# Patient Record
Sex: Female | Born: 1990 | Race: Black or African American | Hispanic: No | Marital: Single | Smoking: Former smoker
Health system: Southern US, Community
[De-identification: ages and names within clinical notes are randomized; demographics above are authoritative.]

## PROBLEM LIST (undated history)

## (undated) DIAGNOSIS — F1911 Other psychoactive substance abuse, in remission: Secondary | ICD-10-CM

## (undated) DIAGNOSIS — F250 Schizoaffective disorder, bipolar type: Secondary | ICD-10-CM

## (undated) HISTORY — DX: Schizoaffective disorder, bipolar type: F25.0

## (undated) HISTORY — DX: Other psychoactive substance abuse, in remission: F19.11

## (undated) HISTORY — PX: WISDOM TOOTH EXTRACTION: SHX21

---

## 2014-06-14 LAB — URINALYSIS, COMPLETE
BILIRUBIN, UR: NEGATIVE
BLOOD: NEGATIVE
Bacteria: NONE SEEN
Glucose,UR: NEGATIVE mg/dL (ref 0–75)
LEUKOCYTE ESTERASE: NEGATIVE
Nitrite: NEGATIVE
PH: 5 (ref 4.5–8.0)
Protein: 100
RBC,UR: 1 /HPF (ref 0–5)
Specific Gravity: 1.033 (ref 1.003–1.030)
WBC UR: 1 /HPF (ref 0–5)

## 2014-06-14 LAB — CBC
HCT: 36.7 % (ref 35.0–47.0)
HGB: 11.7 g/dL — AB (ref 12.0–16.0)
MCH: 26.5 pg (ref 26.0–34.0)
MCHC: 31.9 g/dL — AB (ref 32.0–36.0)
MCV: 83 fL (ref 80–100)
Platelet: 339 10*3/uL (ref 150–440)
RBC: 4.42 10*6/uL (ref 3.80–5.20)
RDW: 15.8 % — ABNORMAL HIGH (ref 11.5–14.5)
WBC: 8 10*3/uL (ref 3.6–11.0)

## 2014-06-14 LAB — ETHANOL: Ethanol: <3 mg/dL

## 2014-06-14 LAB — COMPREHENSIVE METABOLIC PANEL
ANION GAP: 14 (ref 7–16)
AST: 24 U/L (ref 15–37)
Albumin: 4 g/dL (ref 3.4–5.0)
Alkaline Phosphatase: 50 U/L (ref 46–116)
BILIRUBIN TOTAL: 0.3 mg/dL (ref 0.2–1.0)
BUN: 12 mg/dL (ref 7–18)
Calcium, Total: 9.4 mg/dL (ref 8.5–10.1)
Chloride: 108 mmol/L — ABNORMAL HIGH (ref 98–107)
Co2: 23 mmol/L (ref 21–32)
Creatinine: 0.91 mg/dL (ref 0.60–1.30)
EGFR (African American): >60
Glucose: 93 mg/dL (ref 65–99)
OSMOLALITY: 288 (ref 275–301)
Potassium: 4.3 mmol/L (ref 3.5–5.1)
SGPT (ALT): 20 U/L (ref 14–63)
SODIUM: 145 mmol/L (ref 136–145)
Total Protein: 8.1 g/dL (ref 6.4–8.2)

## 2014-06-14 LAB — SALICYLATE LEVEL

## 2014-06-14 LAB — DRUG SCREEN, URINE
Amphetamines, Ur Screen: NEGATIVE (ref ?–1000)
Barbiturates, Ur Screen: NEGATIVE (ref ?–200)
Benzodiazepine, Ur Scrn: NEGATIVE (ref ?–200)
CANNABINOID 50 NG, UR ~~LOC~~: POSITIVE (ref ?–50)
Cocaine Metabolite,Ur ~~LOC~~: NEGATIVE (ref ?–300)
MDMA (Ecstasy)Ur Screen: NEGATIVE (ref ?–500)
Methadone, Ur Screen: NEGATIVE (ref ?–300)
Opiate, Ur Screen: NEGATIVE (ref ?–300)
PHENCYCLIDINE (PCP) UR S: NEGATIVE (ref ?–25)
Tricyclic, Ur Screen: NEGATIVE (ref ?–1000)

## 2014-06-14 LAB — ACETAMINOPHEN LEVEL

## 2014-06-16 ENCOUNTER — Inpatient Hospital Stay: Payer: Self-pay | Admitting: Psychiatry

## 2014-06-18 LAB — TSH: Thyroid Stimulating Horm: 0.584 u[IU]/mL

## 2014-06-18 LAB — LIPID PANEL
Cholesterol: 163 mg/dL (ref 0–200)
HDL Cholesterol: 94 mg/dL — ABNORMAL HIGH (ref 40–60)
LDL CHOLESTEROL, CALC: 57 mg/dL (ref 0–100)
Triglycerides: 60 mg/dL (ref 0–200)
VLDL CHOLESTEROL, CALC: 12 mg/dL (ref 5–40)

## 2014-06-18 LAB — HEMOGLOBIN A1C: Hemoglobin A1C: 5.7 % (ref 4.2–6.3)

## 2014-09-13 NOTE — Consult Note (Signed)
Brief Consult Note: Comments: Have attempted twice today to interview patient but have not been able to arrouse her enough for her to talk to me yet. Remains asleep after a period of reported bizzare behavior this morning. Will reattempt before the end of the day.  Electronic Signatures: Audery Amellapacs, John T (MD)  (Signed 01-Feb-16 15:28)  Authored: Brief Consult Note   Last Updated: 01-Feb-16 15:28 by Audery Amellapacs, John T (MD)
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
## 2014-09-13 NOTE — Consult Note (Signed)
Brief Consult Note: Comments: Attempted again to interview patient later in the afternoon but again she would not or could not get awake enough for conversation. Reattempt tomorrow.  Electronic Signatures: Audery Amellapacs, John T (MD)  (Signed 01-Feb-16 21:42)  Authored: Brief Consult Note   Last Updated: 01-Feb-16 21:42 by Audery Amellapacs, John T (MD)

## 2015-12-26 ENCOUNTER — Emergency Department
Admission: EM | Admit: 2015-12-26 | Discharge: 2015-12-28 | Disposition: A | Discharge status: Other Acute Inpatient Hospital | Payer: Self-pay | Attending: Emergency Medicine | Admitting: Emergency Medicine

## 2015-12-26 ENCOUNTER — Encounter: Payer: Self-pay | Admitting: Emergency Medicine

## 2015-12-26 DIAGNOSIS — F3164 Bipolar disorder, current episode mixed, severe, with psychotic features: Secondary | ICD-10-CM

## 2015-12-26 DIAGNOSIS — F17200 Nicotine dependence, unspecified, uncomplicated: Secondary | ICD-10-CM | POA: Insufficient documentation

## 2015-12-26 DIAGNOSIS — F29 Unspecified psychosis not due to a substance or known physiological condition: Secondary | ICD-10-CM | POA: Insufficient documentation

## 2015-12-26 DIAGNOSIS — Z5181 Encounter for therapeutic drug level monitoring: Secondary | ICD-10-CM | POA: Insufficient documentation

## 2015-12-26 LAB — URINE DRUG SCREEN, QUALITATIVE (ARMC ONLY)
Amphetamines, Ur Screen: NOT DETECTED
Barbiturates, Ur Screen: NOT DETECTED
Benzodiazepine, Ur Scrn: NOT DETECTED
Cannabinoid 50 Ng, Ur ~~LOC~~: POSITIVE — AB
Cocaine Metabolite,Ur ~~LOC~~: NOT DETECTED
MDMA (Ecstasy)Ur Screen: NOT DETECTED
Methadone Scn, Ur: NOT DETECTED
Opiate, Ur Screen: NOT DETECTED
Phencyclidine (PCP) Ur S: NOT DETECTED
Tricyclic, Ur Screen: NOT DETECTED

## 2015-12-26 LAB — COMPREHENSIVE METABOLIC PANEL
ALT: 19 U/L (ref 14–54)
AST: 22 U/L (ref 15–41)
Albumin: 4.3 g/dL (ref 3.5–5.0)
Alkaline Phosphatase: 46 U/L (ref 38–126)
Anion gap: 8 (ref 5–15)
BUN: 11 mg/dL (ref 6–20)
CHLORIDE: 104 mmol/L (ref 101–111)
CO2: 23 mmol/L (ref 22–32)
CREATININE: 0.97 mg/dL (ref 0.44–1.00)
Calcium: 9.8 mg/dL (ref 8.9–10.3)
GFR calc Af Amer: >60 mL/min (ref >60)
GFR calc non Af Amer: >60 mL/min (ref >60)
Glucose, Bld: 138 mg/dL — ABNORMAL HIGH (ref 65–99)
Potassium: 3.9 mmol/L (ref 3.5–5.1)
SODIUM: 135 mmol/L (ref 135–145)
Total Bilirubin: 0.5 mg/dL (ref 0.3–1.2)
Total Protein: 8.1 g/dL (ref 6.5–8.1)

## 2015-12-26 LAB — CBC
HEMATOCRIT: 37.3 % (ref 35.0–47.0)
Hemoglobin: 12.4 g/dL (ref 12.0–16.0)
MCH: 27.4 pg (ref 26.0–34.0)
MCHC: 33.3 g/dL (ref 32.0–36.0)
MCV: 82.5 fL (ref 80.0–100.0)
PLATELETS: 354 10*3/uL (ref 150–440)
RBC: 4.52 MIL/uL (ref 3.80–5.20)
RDW: 17.8 % — ABNORMAL HIGH (ref 11.5–14.5)
WBC: 5.4 10*3/uL (ref 3.6–11.0)

## 2015-12-26 LAB — PREGNANCY, URINE: Preg Test, Ur: NEGATIVE

## 2015-12-26 LAB — ETHANOL: Alcohol, Ethyl (B): <5 mg/dL (ref <5)

## 2015-12-26 MED ORDER — LORAZEPAM 2 MG/ML IJ SOLN
INTRAMUSCULAR | Status: AC
  Administered 2015-12-26: 2 mg via INTRAMUSCULAR
  Filled 2015-12-26: qty 1

## 2015-12-26 MED ORDER — RISPERIDONE 1 MG PO TBDP: 1.0000 mg | ORAL_TABLET | ORAL | Status: DC

## 2015-12-26 MED ORDER — HALOPERIDOL LACTATE 5 MG/ML IJ SOLN
INTRAMUSCULAR | Status: AC
  Administered 2015-12-26: 5 mg via INTRAMUSCULAR
  Filled 2015-12-26: qty 1

## 2015-12-26 MED ORDER — HALOPERIDOL LACTATE 5 MG/ML IJ SOLN
5.0000 mg | Freq: Once | INTRAMUSCULAR | Status: AC
  Administered 2015-12-26: 5 mg via INTRAMUSCULAR

## 2015-12-26 MED ORDER — PALIPERIDONE ER 3 MG PO TB24
3.0000 mg | ORAL_TABLET | Freq: Every day | ORAL | Status: DC
  Administered 2015-12-27: 3 mg via ORAL

## 2015-12-26 MED ORDER — LORAZEPAM 2 MG PO TABS
2.0000 mg | ORAL_TABLET | ORAL | Status: DC | PRN
  Administered 2015-12-27 (×2): 2 mg via ORAL
  Filled 2015-12-26 (×2): qty 1

## 2015-12-26 MED ORDER — LORAZEPAM 2 MG/ML IJ SOLN
2.0000 mg | Freq: Once | INTRAMUSCULAR | Status: AC
  Administered 2015-12-26: 2 mg via INTRAMUSCULAR
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
## 2015-12-26 NOTE — ED Notes (Signed)
Pt states she has panic attacks and states she is trying to calm herself down. Pt has taken medication in the past for anxiety but is not currently on anything. Father also states she cries a lot uncontrollably as well.

## 2015-12-26 NOTE — ED Notes (Signed)
Patient initially put ordered medications in her mouth but spit them out. She started screaming "who is my mother?" and making bizarre threatening statements.

## 2015-12-26 NOTE — ED Notes (Signed)
TTS at bedside at this time.  

## 2015-12-26 NOTE — ED Notes (Signed)
Case discussed with EDMD - Dr. Shaune PollackLord. Described patient behaviors and requested change for IM medications. Dr. Shaune PollackLord referred case to psychiatrist. Dr. Toni Amendlapacs ordered new medications.

## 2015-12-26 NOTE — ED Notes (Signed)
Pt singing and talking loudly in her room. Pt is alone. Pt nurse goes into room to ask her to talk quietly. Pt States" I would like to go home with my parents now". Nurse informs pt that she needs to stay until she is evaluated by Psychiatrist. Pt states "fine I will do what ever you need me to do"

## 2015-12-26 NOTE — ED Triage Notes (Signed)
Pt has been living alone for the past 6-7 months and recently moved back in with family last week. Pt denies any SI/HI but states she has had SI in the past when she was younger.

## 2015-12-26 NOTE — ED Provider Notes (Signed)
[facility] Emergency Department Provider Note ____________________________________________   I have reviewed the triage vital signs and the triage nursing note.  HISTORY  Chief Complaint Psychiatric Evaluation   Historian Limited due to patient's apparent altered mental status/psychosis. She is a poor historian.  HPI Jodi Stephens is a 25 y.o. female without significant past medical history, but she states that she was treated for anxiety in the past, who is presenting here due to labile mood, and panic attacks. Patient states that she has dealt with anxiety for many years.She states that recently she moved back in with her parents because she was not doing well living on her own. 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 "When I asked her if she hears voices, and her from the television, she says that they transmit to her brain.  She asked me if I have a implant in my ear, and when I asked her why she thinks to have an implant in my ear, she stated that it was because I had stutter (I do not have a stutter).  She burst into tears intermittently. She told me about how she thinks strangers are saying bad things about her. She goes back to high school times when she states that she was playing basketball and other people got more tension than she did.  Denies suicidal ideation. Denies any self injury.  She states that she has been hearing voices and people's and sounds that are not there.  No reported prior history of psychiatric hospitalization.   History reviewed. No pertinent past medical history.  There are no active problems to display for this patient.   History reviewed. No pertinent surgical history.  Prior to Admission medications   Not on File    No Known Allergies  History reviewed. No  pertinent family history.  Social History Social History  Substance Use Topics  . Smoking status: Current Some Day Smoker  . Smokeless tobacco: Not on file  . Alcohol use Yes    Review of Systems  Constitutional: Negative for Recent illness. Eyes: Negative for visual changes. ENT: Negative for sore throat. Cardiovascular: Negative for chest pain. Respiratory: Negative for shortness of breath. Gastrointestinal: Negative for abdominal pain, vomiting and diarrhea. Genitourinary: Negative for dysuria. Musculoskeletal: Negative for back pain. Skin: Negative for rash. Neurological: Negative for headache. 10 point Review of Systems otherwise negative ____________________________________________   PHYSICAL EXAM:  VITAL SIGNS: ED Triage Vitals  Enc Vitals Group     BP 12/26/15 0944 (!) 159/97     Pulse Rate 12/26/15 0944 (!) 106     Resp 12/26/15 0944 (!) 22     Temp 12/26/15 0944 99.3 F (37.4 C)     Temp Source 12/26/15 0944 Oral     SpO2 12/26/15 0944 100 %     Weight --      Height 12/26/15 0945  (1.626 m)     Head Circumference --      Peak Flow --      Pain Score 12/26/15 0940 0     Pain Loc --      Pain Edu? --      Excl. in GC? --      Constitutional: Alert and Cooperative, and oriented, but some delusions. Well appearing overall and in no distress. HEENT  Head: Normocephalic and atraumatic.      Eyes: Conjunctivae are normal. PERRL. Normal extraocular movements.      Ears:         Nose: No congestion/rhinnorhea.   Mouth/Throat: Mucous membranes are moist.   Neck: No stridor. Cardiovascular/Chest: Normal rate, regular rhythm.  No murmurs, rubs, or gallops. Respiratory: Normal respiratory effort without tachypnea nor retractions. Breath sounds are clear and equal bilaterally. No wheezes/rales/rhonchi. Gastrointestinal: Soft. No distention, no guarding, no rebound. Nontender.    Genitourinary/rectal:Deferred Musculoskeletal: Nontender with  normal range of motion in all extremities. No joint effusions.  No lower extremity tenderness.  No edema. Neurologic:  Normal speech and language. No gross or focal neurologic deficits are appreciated. Skin:  Skin is warm, dry and intact. No rash noted. Psychiatric: Labile mood, occasionally bursting into tears, otherwise affect is at times bright, and at other times very paranoid. She has pretty scattered thought process, jumping around, difficult to follow. She endorses hearing voices. She endorses paranoia.  She seems to have some delusions about implanted computers in other people.  ____________________________________________   EKG I, Governor Rooksebecca Jandiel Magallanes, MD, the attending physician have personally viewed and interpreted all ECGs.  None ____________________________________________  LABS (pertinent positives/negatives)  Labs Reviewed  COMPREHENSIVE METABOLIC PANEL - Abnormal; Notable for the following:       Result Value   Glucose, Bld 138 (*)    All other components within normal limits  CBC - Abnormal; Notable for the following:    RDW 17.8 (*)    All other components within normal limits  ETHANOL  URINE DRUG SCREEN, QUALITATIVE ([facility] ONLY)  POC URINE PREG, ED    ____________________________________________  RADIOLOGY All Xrays were viewed by me. Imaging interpreted by Radiologist.  None __________________________________________  PROCEDURES  Procedure(s) performed: None  Critical Care performed: None  ____________________________________________   ED COURSE / ASSESSMENT AND PLAN  Pertinent labs & imaging results that were available during my care of the patient were reviewed by me and considered in my medical decision making (see chart for details).   This patient's chief complaint was anxiety, but when I went to speak with her and evaluate her, it was clear that something much more significant was going on. Her thought process is disorganized, her mood is labile,  she is endorsing auditory hallucinations, and is paranoid and delusional about myself having something implanted in my brain.  I placed on involuntary commitment so that she can certainly have psychiatric evaluation.  No evidence of symptoms or physical findings making me concerned for medical emergency.  Disposition per psychiatry/behavioral health team.    CONSULTATIONS:  TTS and psychiatrist, pending   Patient / Family / Caregiver informed of clinical course, medical decision-making process, and agree with plan.     ___________________________________________   FINAL CLINICAL IMPRESSION(S) / ED DIAGNOSES   Final diagnoses:  Psychosis, unspecified psychosis type              Note: This dictation was prepared with Dragon dictation. Any transcriptional errors that result from this process are unintentional    Governor Rooksebecca Patty Lopezgarcia, MD 12/26/15 1223

## 2015-12-26 NOTE — ED Notes (Signed)
Patient briefly awake, offered juice which she refused. She then went back to sleep. Maintained on 15 minute checks and observation by security camera for safety.

## 2015-12-26 NOTE — Consult Note (Signed)
[facility] Consult   Reason for Consult:  Consult for 25 year old woman brought in apparently by her family for worsening psychotic symptoms Referring Physician:  Reita Cliche Patient Identification: Jodi Stephens Principal Diagnosis: Bipolar affective disorder, mixed, severe, with psychotic behavior ([place]) Diagnosis:   Patient Active Problem List   Diagnosis Date Noted  . Bipolar affective disorder, mixed, severe, with psychotic behavior ([place]) [F31.64] 12/26/2015  . Cannabis abuse [F12.10] 12/26/2015  . Suicidal ideation [R45.851] 12/26/2015    Total Time spent with patient: 1 hour  Subjective:   Jodi Stephens is a 25 y.o. female patient admitted with "they are telling me to kill myself".  HPI:  Patient interviewed. Chart reviewed. Labs and vitals reviewed. Old chart reviewed such as it is. 25 year old woman brought in to the emergency room presumably by her family because of worsening psychosis. Most of the history obtained from the patient. She says that for some period of time probably more than a week her mood has been very bad. She has a hard time describing it but it sounds like it's been a combination of depressed and angry. She says she hasn't slept in a long time probably over a week. Her appetite has been poor. She describes having auditory hallucinations saying that there are voices telling her to kill her self. All of this makes her history sound more organized than it is. The patient is very disorganized in her thinking. Most of what she says is off-topic non sequiturs or shouting into the air and waving her arms around. Only occasionally does she answer some questions somewhat rationally. She does indicate that she's feeling very bad. She tells me she thinks that maybe somebody put some speed into the marijuana she's been smoking but says that she hasn't had any marijuana in over a week. Denies that she's been using any other drugs. Patient is not  able to tell me of any particular new emotional stress. Evidently she normally lives by herself but for some time has been back living with her parents because of how poorly she's been functioning. She hasn't been able to work anytime recently. She is apparently not currently getting any kind of outpatient treatment or seeing a psychiatrist. Patient says she has not actually overdosed or taken anything dangerous or done anything specific to try to hurt herself.  Social history: Not married. Apparently currently living with her parents although previously had been staying by herself. Last job was working at a [employer] but had to leave that some time ago.  Medical history: No known medical problems outside of the psychiatric. Her only prescription medicines or oral birth control pill.  Substance abuse history: Usually routinely uses marijuana. She is not a good historian about the details of that but claims she hasn't used any in about a week. Denies that she's been drinking or using any other drugs that she knows of.  Past Psychiatric History: Patient has had at least 1 prior psychiatric admission here in early 2016. We have the history and physical for that but not the discharge summary so on not sure with the final conclusions were that she presented with psychotic like symptoms and mood symptoms at that time as well. Patient is not a very reliable historian but seems to indicate she has not been getting any interim mental health treatment. History of suicidal thinking but is not clear about whether she has ever done anything to try to harm herself. I'm not sure what  medicine she had been on previously again because we don't have the prior discharge summary.  Risk to Self: Suicidal Ideation: No Suicidal Intent: No Is patient at risk for suicide?: No Suicidal Plan?: No Access to Means: No What has been your use of drugs/alcohol within the last 12 months?: Alcohol & Cannabis How many times?:   ("I don't know") Risk to Others: Homicidal Ideation: No Thoughts of Harm to Others: No Current Homicidal Intent: No Current Homicidal Plan: No Access to Homicidal Means: No Identified Victim: Reports of none History of harm to others?: No Violent Behavior Description: Reports of none Does patient have access to weapons?: No Prior Inpatient Therapy:   Prior Outpatient Therapy:    Past Medical History: History reviewed. No pertinent past medical history. History reviewed. No pertinent surgical history. Family History: History reviewed. No pertinent family history. Family Psychiatric  History: She had an aunt who has schizophrenia. That's the only known family history of mental illness Social History:  History  Alcohol Use  . Yes     History  Drug Use  . Types: Marijuana    Social History   Social History  . Marital status: Single    Spouse name: N/A  . Number of children: N/A  . Years of education: N/A   Social History Main Topics  . Smoking status: Current Some Day Smoker  . Smokeless tobacco: None  . Alcohol use Yes  . Drug use:     Types: Marijuana  . Sexual activity: Not Asked   Other Topics Concern  . None   Social History Narrative  . None   Additional Social History:    Allergies:  No Known Allergies  Labs:  Results for orders placed or performed during the hospital encounter of 12/26/15 (from the past 48 hour(s))  Comprehensive metabolic panel     Status: Abnormal   Collection Time: 12/26/15  9:55 AM  Result Value Ref Range   Sodium 135 135 - 145 mmol/L   Potassium 3.9 3.5 - 5.1 mmol/L   Chloride 104 101 - 111 mmol/L   CO2 23 22 - 32 mmol/L   Glucose, Bld 138 (H) 65 - 99 mg/dL   BUN 11 6 - 20 mg/dL   Creatinine, Ser 0.97 0.44 - 1.00 mg/dL   Calcium 9.8 8.9 - 10.3 mg/dL   Total Protein 8.1 6.5 - 8.1 g/dL   Albumin 4.3 3.5 - 5.0 g/dL   AST 22 15 - 41 U/L   ALT 19 14 - 54 U/L   Alkaline Phosphatase 46 38 - 126 U/L   Total Bilirubin 0.5 0.3 -  1.2 mg/dL   GFR calc non Af Amer >60 >60 mL/min   GFR calc Af Amer >60 >60 mL/min    Comment: (NOTE) The eGFR has been calculated using the CKD EPI equation. This calculation has not been validated in all clinical situations. eGFR's persistently <60 mL/min signify possible Chronic Kidney Disease.    Anion gap 8 5 - 15  Ethanol     Status: None   Collection Time: 12/26/15  9:55 AM  Result Value Ref Range   Alcohol, Ethyl (B) <5 <5 mg/dL    Comment:        LOWEST DETECTABLE LIMIT FOR SERUM ALCOHOL IS 5 mg/dL FOR MEDICAL PURPOSES ONLY   cbc     Status: Abnormal   Collection Time: 12/26/15  9:55 AM  Result Value Ref Range   WBC 5.4 3.6 - 11.0 K/uL   RBC 4.52  3.80 - 5.20 MIL/uL   Hemoglobin 12.4 12.0 - 16.0 g/dL   HCT 37.3 35.0 - 47.0 %   MCV 82.5 80.0 - 100.0 fL   MCH 27.4 26.0 - 34.0 pg   MCHC 33.3 32.0 - 36.0 g/dL   RDW 17.8 (H) 11.5 - 14.5 %   Platelets 354 150 - 440 K/uL  Urine Drug Screen, Qualitative     Status: Abnormal   Collection Time: 12/26/15  9:56 AM  Result Value Ref Range   Tricyclic, Ur Screen NONE DETECTED NONE DETECTED   Amphetamines, Ur Screen NONE DETECTED NONE DETECTED   MDMA (Ecstasy)Ur Screen NONE DETECTED NONE DETECTED   Cocaine Metabolite,Ur [place] NONE DETECTED NONE DETECTED   Opiate, Ur Screen NONE DETECTED NONE DETECTED   Phencyclidine (PCP) Ur S NONE DETECTED NONE DETECTED   Cannabinoid 50 Ng, Ur [place] POSITIVE (A) NONE DETECTED   Barbiturates, Ur Screen NONE DETECTED NONE DETECTED   Benzodiazepine, Ur Scrn NONE DETECTED NONE DETECTED   Methadone Scn, Ur NONE DETECTED NONE DETECTED    Comment: (NOTE) 578  Tricyclics, urine               Cutoff 1000 ng/mL 200  Amphetamines, urine             Cutoff 1000 ng/mL 300  MDMA (Ecstasy), urine           Cutoff 500 ng/mL 400  Cocaine Metabolite, urine       Cutoff 300 ng/mL 500  Opiate, urine                   Cutoff 300 ng/mL 600  Phencyclidine (PCP), urine      Cutoff 25 ng/mL 700  Cannabinoid, urine               Cutoff 50 ng/mL 800  Barbiturates, urine             Cutoff 200 ng/mL 900  Benzodiazepine, urine           Cutoff 200 ng/mL 1000 Methadone, urine                Cutoff 300 ng/mL 1100 1200 The urine drug screen provides only a preliminary, unconfirmed 1300 analytical test result and should not be used for non-medical 1400 purposes. Clinical consideration and professional judgment should 1500 be applied to any positive drug screen result due to possible 1600 interfering substances. A more specific alternate chemical method 1700 must be used in order to obtain a confirmed analytical result.  1800 Gas chromato graphy / mass spectrometry (GC/MS) is the preferred 1900 confirmatory method.     Current Facility-Administered Medications  Medication Dose Route Frequency Provider Last Rate Last Dose  . LORazepam (ATIVAN) tablet 2 mg  2 mg Oral Q4H PRN Gonzella Lex, MD      . paliperidone (INVEGA) 24 hr tablet 3 mg  3 mg Oral QHS John T Clapacs, MD      . risperiDONE (RISPERDAL M-TABS) disintegrating tablet 1 mg  1 mg Oral NOW Gonzella Lex, MD       No current outpatient prescriptions on file.    Musculoskeletal: Strength & Muscle Tone: within normal limits Gait & Station: normal Patient leans: N/A  Psychiatric Specialty Exam: Physical Exam  Nursing note and vitals reviewed. Constitutional: She appears well-developed and well-nourished.  HENT:  Head: Normocephalic and atraumatic.  Eyes: Conjunctivae are normal. Pupils are equal, round, and reactive to light.  Neck: Normal range of  motion.  Cardiovascular: Regular rhythm and normal heart sounds.   Respiratory: She is in respiratory distress.  GI: Soft.  Musculoskeletal: Normal range of motion.  Neurological: She is alert.  Skin: Skin is warm and dry.  Psychiatric: Her affect is angry, labile and inappropriate. Her speech is tangential. She is agitated and actively hallucinating. Thought content is paranoid and delusional.  She expresses impulsivity. She expresses suicidal ideation. She is noncommunicative. She exhibits abnormal remote memory.    Review of Systems  Constitutional: Negative.   HENT: Negative.   Eyes: Negative.   Respiratory: Negative.   Cardiovascular: Negative.   Gastrointestinal: Negative.   Musculoskeletal: Negative.   Skin: Negative.   Neurological: Negative.   Psychiatric/Behavioral: Positive for depression, hallucinations, substance abuse and suicidal ideas. Negative for memory loss. The patient is nervous/anxious and has insomnia.     Blood pressure (!) 159/97, pulse (!) 106, temperature 99.3 F (37.4 C), temperature source Oral, resp. rate (!) 22, height _0  (1.626 m), last menstrual period 11/24/2015, SpO2 100 %.There is no height or weight on file to calculate BMI.  General Appearance: Casual  Eye Contact:  Minimal  Speech:  Garbled and Pressured  Volume:  Increased  Mood:  Angry, Anxious and Irritable  Affect:  Inappropriate  Thought Process:  Disorganized  Orientation:  Full (Time, Place, and Person)  Thought Content:  Illogical, Delusions and Paranoid Ideation  Suicidal Thoughts:  Yes.  without intent/plan  Homicidal Thoughts:  No  Memory:  Immediate;   Good Recent;   Fair Remote;   Poor  Judgement:  Impaired  Insight:  Shallow  Psychomotor Activity:  Decreased  Concentration:  Concentration: Fair  Recall:  [employer] of Knowledge:  Fair  Language:  Fair  Akathisia:  No  Handed:  Right  AIMS (if indicated):     Assets:  Housing Physical Health Resilience Social Support  ADL's:  Impaired  Cognition:  Impaired,  Mild  Sleep:        Treatment Plan Summary: Daily contact with patient to assess and evaluate symptoms and progress in treatment, Medication management and Plan 25 year old woman who presents psychotic agitated but with a bad mood and reporting having hallucinations with suicidal content. Not sleeping for days. Doesn't sound like she's been eating  well. Although she is not obviously dirty she does smell and indicates that she has not been taking care of herself very well recently. Differential diagnosis includes bipolar disorder with currently mixed presentation, schizophrenia, possibly psychotic depression, possibly substance-induced although that seems less likely. Patient needs admission to the psychiatric hospital. IVC has been filed. I am going to try giving her a 1 time dose of risperidone now and have when necessary Ativan available for anxiety. I'm going to start her on invega at night in hopes that she may ultimately be able to be put on a long-acting injectable. I'm going to defer antidepressants or other mood stabilizers sides antipsychotics for now until the primary treatment team sees her. Plan explained to the patient. Full lab complement ordered. Admission orders done. Reviewed with emergency room and TTS.  Disposition: Recommend psychiatric Inpatient admission when medically cleared. Supportive therapy provided about ongoing stressors.  Alethia Berthold, MD 12/26/2015 1:37 PM

## 2015-12-26 NOTE — ED Notes (Signed)
Patient asleep in room. No noted distress or abnormal behavior. Will continue 15 minute checks and observation by security cameras for safety. 

## 2015-12-26 NOTE — ED Triage Notes (Signed)
Pt arrived via POV with family. Pt states " I believe in God"  Pt states she has heard voices and has had visual hallucinations but cannot elaborate at this time. Except says "I believe in God" Pt is anxious and has a bizarre affect in triage.

## 2015-12-26 NOTE — ED Notes (Signed)
Patient resting with eyes closed in bed. Respirations even and un-labored. Unable to preform assessment at this time.

## 2015-12-26 NOTE — ED Notes (Signed)
ENVIRONMENTAL ASSESSMENT Potentially harmful objects out of patient reach: Yes Personal belongings secured: Yes Patient dressed in hospital provided attire only: Yes Plastic bags out of patient reach: Yes Patient care equipment (cords, cables, call bells, lines, and drains) shortened, removed, or accounted for: Yes Equipment and supplies removed from bottom of stretcher: Yes Potentially toxic materials out of patient reach: Yes Sharps container removed or out of patient reach: Yes  Patient assigned to appropriate care area. Patient oriented to unit/care area: Informed that, for their safety, care areas are designed for safety and monitored by security cameras at all times; and visiting hours explained to patient.   Patient acutely psychotic. Unable to process information.

## 2015-12-26 NOTE — ED Notes (Signed)
[facility]

## 2015-12-26 NOTE — ED Notes (Signed)
Patient screaming and agitated when approached by staff to give medications IM. However, she did allow staff to give injection without needing any hands on assistance by security. Will continue to monitor clinical status and maintain on all safety checks.

## 2015-12-26 NOTE — ED Notes (Signed)
Patient currently asleep. Maintained on 15 minute checks and observation by security camera for safety.

## 2015-12-27 NOTE — BH Assessment (Signed)
Patient is unable to transfer to the [name]([name], Assistant Director) at this time, due to patient's acuity and the acuity of the unit.    [name] ([name], [name]) do not have any appropriate beds for the patient at this time.  Referral Information for Psychiatric Inpatient Treatment have been faxed to;    [facility] [phone]. [phone]   [facility] [phone]),    [facility] [phone]),    [name] [phone]),    [name] [phone])   [name] [phone]),    [name] [phone]),    [name] [phone]).

## 2015-12-27 NOTE — ED Notes (Signed)
Pt in general is much better, less confused, following directions better, took po meds and is now watching tv with peers has talked with her parents and the conversations seemed to be appropraite

## 2015-12-27 NOTE — ED Notes (Signed)
Patient woke up and consented for vitals to be obtained. Patient is alert and oriented x 3. Patient denies SI/HI/AVH. Patient states, "I was just really angry yesterday." This writer gave patient a cup of ice water and some graham crackers and peanut butter. Patient went to restroom.

## 2015-12-27 NOTE — ED Notes (Signed)

## 2015-12-27 NOTE — ED Provider Notes (Signed)
-----------------------------------------   7:47 AM on 12/27/2015 -----------------------------------------   Blood pressure 122/82, pulse (!) 111, temperature 97.4 F (36.3 C), temperature source Oral, resp. rate 18, height 5\' 4"  (1.626 m), last menstrual period 11/24/2015, SpO2 100 %.  The patient had no acute events since last update.  Calm and cooperative at this time.  Disposition is pending per Psychiatry/Behavioral Medicine team recommendations.  We will give the patient some oral hydration and reassess the patient's heart rate   Rebecka ApleyAllison P Cleo Villamizar, MD 12/27/15 [phone]

## 2015-12-27 NOTE — ED Notes (Signed)
Spoke with Dr Raynelle CharySchavitz about vs and pt anxiety , pt medicated she took po meds willingly

## 2015-12-27 NOTE — ED Notes (Signed)
Pt. Alert and oriented, warm and dry, in no distress. Pt. Denies SI, HI. Pt reports she does here voices. When asked what are the voices telling you pt states, "Just to give up." This writer advised patient to never give up and this is just a bump in the road, hopefully the medications with start to help with the voices. Pt. Encouraged to let nursing staff know of any concerns or needs.

## 2015-12-28 ENCOUNTER — Inpatient Hospital Stay
Admission: EM | Admit: 2015-12-28 | Discharge: 2016-01-10 | DRG: 885 | Disposition: A | Discharge status: Home / Self Care | Payer: No Typology Code available for payment source | Source: Intra-hospital | Attending: Psychiatry | Admitting: Psychiatry

## 2015-12-28 DIAGNOSIS — F250 Schizoaffective disorder, bipolar type: Principal | ICD-10-CM

## 2015-12-28 DIAGNOSIS — Z818 Family history of other mental and behavioral disorders: Secondary | ICD-10-CM | POA: Diagnosis not present

## 2015-12-28 DIAGNOSIS — N390 Urinary tract infection, site not specified: Secondary | ICD-10-CM | POA: Diagnosis present

## 2015-12-28 DIAGNOSIS — F17210 Nicotine dependence, cigarettes, uncomplicated: Secondary | ICD-10-CM | POA: Diagnosis present

## 2015-12-28 DIAGNOSIS — Z9119 Patient's noncompliance with other medical treatment and regimen: Secondary | ICD-10-CM | POA: Diagnosis not present

## 2015-12-28 DIAGNOSIS — Z811 Family history of alcohol abuse and dependence: Secondary | ICD-10-CM

## 2015-12-28 DIAGNOSIS — F1220 Cannabis dependence, uncomplicated: Secondary | ICD-10-CM

## 2015-12-28 DIAGNOSIS — E221 Hyperprolactinemia: Secondary | ICD-10-CM

## 2015-12-28 DIAGNOSIS — R45851 Suicidal ideations: Secondary | ICD-10-CM | POA: Diagnosis present

## 2015-12-28 DIAGNOSIS — F17200 Nicotine dependence, unspecified, uncomplicated: Secondary | ICD-10-CM | POA: Diagnosis present

## 2015-12-28 DIAGNOSIS — F3164 Bipolar disorder, current episode mixed, severe, with psychotic features: Secondary | ICD-10-CM | POA: Diagnosis present

## 2015-12-28 DIAGNOSIS — G4700 Insomnia, unspecified: Secondary | ICD-10-CM | POA: Diagnosis present

## 2015-12-28 DIAGNOSIS — R000 Tachycardia, unspecified: Secondary | ICD-10-CM | POA: Diagnosis present

## 2015-12-28 DIAGNOSIS — Z91410 Personal history of adult physical and sexual abuse: Secondary | ICD-10-CM

## 2015-12-28 LAB — LIPID PANEL
CHOL/HDL RATIO: 2.1 ratio
Cholesterol: 151 mg/dL (ref 0–200)
HDL: 72 mg/dL (ref >40)
LDL CALC: 67 mg/dL (ref 0–99)
Triglycerides: 61 mg/dL (ref <150)
VLDL: 12 mg/dL (ref 0–40)

## 2015-12-28 LAB — TSH: TSH: 0.37 u[IU]/mL (ref 0.350–4.500)

## 2015-12-28 MED ORDER — ALUM & MAG HYDROXIDE-SIMETH 200-200-20 MG/5ML PO SUSP: 30.0000 mL | ORAL | Status: DC | PRN

## 2015-12-28 MED ORDER — PALIPERIDONE ER 3 MG PO TB24
3.0000 mg | ORAL_TABLET | Freq: Every day | ORAL | Status: DC
  Administered 2015-12-28: 3 mg via ORAL
  Filled 2015-12-28: qty 1

## 2015-12-28 MED ORDER — MAGNESIUM HYDROXIDE 400 MG/5ML PO SUSP: 30.0000 mL | Freq: Every day | ORAL | Status: DC | PRN

## 2015-12-28 MED ORDER — ACETAMINOPHEN 325 MG PO TABS: 650.0000 mg | ORAL_TABLET | Freq: Four times a day (QID) | ORAL | Status: DC | PRN

## 2015-12-28 MED ORDER — LORAZEPAM 2 MG PO TABS: 2.0000 mg | ORAL_TABLET | ORAL | Status: DC | PRN

## 2015-12-28 MED ORDER — RISPERIDONE 1 MG PO TBDP
1.0000 mg | ORAL_TABLET | ORAL | Status: AC
  Administered 2015-12-28: 1 mg via ORAL
  Filled 2015-12-28: qty 1

## 2015-12-28 NOTE — ED Notes (Signed)
Patient currently talking on the phone. No signs of distress noted. Maintained on 15 minute checks and observation by security camera for safety.

## 2015-12-28 NOTE — ED Notes (Signed)
ED MD made aware of patient BP and pulse. No new orders at this time. Pt is asymptomatic at time of assessment.  Will continue to monitor.
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
## 2015-12-28 NOTE — ED Notes (Signed)
Patient is currently in dayroom speaking with family members on the phone. Patient is calm and cooperative at this time. Maintained on 15 minute checks and observation by security camera for safety.

## 2015-12-28 NOTE — ED Notes (Signed)
Patient was in her room on the floor crying upon assessment. Patient states that she misses her family dearly and also her cat Leia. She said that right now she is in a depressed and low place. Patient was able to contract for safety while in the hospital but was not clear as to whether she would feel suicidal if discharged. Patient has no thoughts of hurting others and denies AVH and pain. Patient told this writer that she started drinking when she was 16 and that she drank because she didn't feel appreciated and noticed by others. She admitted to skin picking and anorexia as a teenager and then began to mourn the death of her friend that comitted suicide while they were in middle school. Patient is cooperative and calm and responsive to nursing interventions. No signs of agitation at this time. Will continue to monitor. Maintained on 15 minute checks and observation by security camera for safety.

## 2015-12-28 NOTE — ED Provider Notes (Signed)
-----------------------------------------   6:47 AM on 12/28/2015 -----------------------------------------   BP (!) 123/97 (BP Location: Left Arm)   Pulse (!) 106   Temp 97.8 F (36.6 C) (Oral)   Resp 16   Ht 5\' 4"  (1.626 m)   LMP 11/24/2015 (Approximate) Comment: pt is on birth control pills  SpO2 100%   No acute events since last update.   Disposition is pending per Psychiatry/Behavioral Medicine team recommendations.     Phineas SemenGraydon Cayman Kielbasa, MD 12/28/15 [phone]

## 2015-12-28 NOTE — ED Notes (Signed)
Patient came to the nurse's station and asked to use the phone. Patient advised of the phone hours and told that she could use the phone at 1. Patient then went back to her room and began to rap to herself and make large movements with her arms. Nurse went to speak with patient and patient was calm and asked for a magazine. Nurse provided magazine and patient no longer had any concerns nor showed signs of agitation. Maintained on 15 minute checks and observation by security camera for safety.

## 2015-12-28 NOTE — Tx Team (Signed)
Initial Interdisciplinary Treatment Plan   PATIENT STRESSORS: Educational concerns Medication change or noncompliance Substance abuse   PATIENT STRENGTHS: Barrister's clerkCommunication skills Motivation for treatment/growth Supportive family/friends   PROBLEM LIST: Problem List/Patient Goals Date to be addressed Date deferred Reason deferred Estimated date of resolution  Subtance Abuse  12/28/15     Suicide  12/28/15     Psychosis 12/28/15`                                          DISCHARGE CRITERIA:  Ability to meet basic life and health needs Improved stabilization in mood, thinking, and/or behavior Withdrawal symptoms are absent or subacute and managed without 24-hour nursing intervention  PRELIMINARY DISCHARGE PLAN: Outpatient therapy Return to previous living arrangement  PATIENT/FAMIILY INVOLVEMENT: This treatment plan has been presented to and reviewed with the patient, Jodi Stephens, and/or family member, .  The patient and family have been given the opportunity to ask questions and make suggestions.  Earland Reish A Samora Jernberg 12/28/2015, 5:08 PM

## 2015-12-28 NOTE — ED Notes (Signed)
Patient currently meeting with her father. No signs of distress noted at this time. Maintained on 15 minute checks and observation by security camera for safety.

## 2015-12-28 NOTE — Plan of Care (Signed)
Problem: Activity: Goal: Interest or engagement in activities will improve Outcome: Not Progressing Thought process altered

## 2015-12-28 NOTE — ED Notes (Signed)
Patient currently in room eating a snack. No signs of distress noted at this time. Maintained on 15 minute checks and observation by security camera for safety.
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
## 2015-12-29 DIAGNOSIS — E221 Hyperprolactinemia: Secondary | ICD-10-CM

## 2015-12-29 DIAGNOSIS — F1220 Cannabis dependence, uncomplicated: Secondary | ICD-10-CM

## 2015-12-29 DIAGNOSIS — F3164 Bipolar disorder, current episode mixed, severe, with psychotic features: Secondary | ICD-10-CM

## 2015-12-29 LAB — PROLACTIN: Prolactin: 145.4 ng/mL — ABNORMAL HIGH (ref 4.8–23.3)

## 2015-12-29 LAB — HEMOGLOBIN A1C: Hgb A1c MFr Bld: 5.1 % (ref 4.0–6.0)

## 2015-12-29 MED ORDER — LORAZEPAM 1 MG PO TABS
1.0000 mg | ORAL_TABLET | Freq: Every day | ORAL | Status: DC
  Administered 2015-12-29 – 2015-12-31 (×2): 1 mg via ORAL
  Filled 2015-12-29 (×2): qty 1

## 2015-12-29 MED ORDER — ARIPIPRAZOLE 5 MG PO TABS
15.0000 mg | ORAL_TABLET | Freq: Every day | ORAL | Status: DC
  Administered 2015-12-29 – 2015-12-30 (×2): 15 mg via ORAL
  Filled 2015-12-29 (×2): qty 1

## 2015-12-29 NOTE — Progress Notes (Signed)
Recreation Therapy Notes  Date: 08.16.17 Time: 1:00 pm Location: Craft Room  Group Topic: Self-esteem  Goal Area(s) Addresses:  Patient will write at least one positive trait. Patient will verbalize benefit of having a healthy self-esteem.  Behavioral Response: Attentive, Interactive  Intervention: I Am  Activity: Patients were given a worksheet with the letter I on it and instructed to write as many positive traits inside the letter.  Education: LRT educated patients on ways they can increase their self-esteem.  Education Outcome: Acknowledges education/In group clarification offered  Clinical Observations/Feedback: Patient completed activity by writing positive traits about herself. Patient contributed to group discussion by stating what she stops doing once she feels good about herself.  Jacquelynn CreeGreene,Jed Kutch M, LRT/CTRS 12/29/2015 2:26 PM

## 2015-12-29 NOTE — Plan of Care (Signed)
Problem: Medication: Goal: Compliance with prescribed medication regimen will improve Outcome: Progressing Pt compliant with medication regimen.   
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
## 2015-12-29 NOTE — Progress Notes (Signed)
DWriter instructed patient on  MRI  voice of talking with her mother about the  Test. Voice of missing her mother  Wanting to see her. Patient stated slept fair last night .Stated appetite is good and energy level  hyper. Stated concentration is good . Stated on Depression scale 0 , hopeless 0 and anxiety 0  .( low 0-10 high) Denies suicidal  homicidal ideations  .  No auditory hallucinations  No pain concerns . Appropriate ADL'S. Interacting with peers and staff.  A: Encourage patient participation with unit programming . Instruction  Given on  Medication , verbalize understanding. R: Voice no other concerns. Staff continue to monitor

## 2015-12-29 NOTE — Tx Team (Signed)
Interdisciplinary Treatment Plan Update (Adult)         Date: 12/29/2015   Time Reviewed: 10:30 AM   Progress in Treatment: Improving Attending groups: Yes  Participating in groups: Yes  Taking medication as prescribed: Yes  Tolerating medication: Yes  Family/Significant other contact made: CSW assessing proper contacts Patient understands diagnosis: Yes  Discussing patient identified problems/goals with staff: Yes  Medical problems stabilized or resolved: Yes  Denies suicidal/homicidal ideation: Yes  Issues/concerns per patient self-inventory: Yes  Other:   New problem(s) identified: N/A   Discharge Plan or Barriers: see below   Reason for Continuation of Hospitalization:   Depression   Anxiety   Medication Stabilization   Comments: N/A   Estimated length of stay: 3-5 days    Patient is a 25 year old female admitted for psychosis. Patient lives in [place], [place].  25 year old woman brought in to the emergency room presumably by her family because of worsening psychosis. Most of the history obtained from the patient. She says that for some period of time probably more than a week her mood has been very bad. She has a hard time describing it but it sounds like it's been a combination of depressed and angry. She says she hasn't slept in a long time probably over a week. Her appetite has been poor. She describes having auditory hallucinations saying that there are voices telling her to kill her self. All of this makes her history sound more organized than it is. The patient is very disorganized in her thinking  Patient will benefit from crisis stabilization, medication evaluation, group therapy, and psycho education in addition to case management for discharge planning. Patient and CSW reviewed pt's identified goals and treatment plan. Pt verbalized understanding and agreed to treatment plan.    Review of initial/current patient goals per problem list:  1. Goal(s): Patient will  participate in aftercare plan   Met: Goal progressing   Target date: 3-5 days post admission date   As evidenced by: Patient will participate within aftercare plan AEB aftercare provider and housing plan at discharge being identified.   CSW is assessing proper aftercare plans.   2. Goal (s): Patient will exhibit decreased depressive symptoms and suicidal ideations.   Met: Goal progressing   Target date: 3-5 days post admission date   As evidenced by: Patient will utilize self-rating of depression at 3 or below and demonstrate decreased signs of depression or be deemed stable for discharge by MD.   Patient reports a depression score of 5 at this time. Denies SI at this time.  3. Goal(s): Patient will demonstrate decreased signs and symptoms of anxiety.   Met: Goal progressing   Target date: 3-5 days post admission date   As evidenced by: Patient will utilize self-rating of anxiety at 3 or below and demonstrated decreased signs of anxiety, or be deemed stable for discharge by MD   Reports an anxiety score of 6 at this time. Abnormal heartbeat.   5. Goal(s): Patient will demonstrate decreased signs of psychosis  * Met: Goal progressing  * Target date: 3-5 days post admission date  * As evidenced by: Patient will demonstrate decreased frequency of AVH or return to baseline function   Pt is still experiencing AVH at this time - seeing things (religious).    Patient: Jodi Stephens Family:  Physician: Merlyn Albert, MD    12/29/2015 10:30AM  Nursing: Polly Cobia, RN     12/29/2015 10:30AM  Clinical Social Worker: Juanito Doom.  Rowe Robert  12/29/2015 10:30AM  Other: Everitt Amber, Recreational Therapist  12/29/2015 10:30AM

## 2015-12-29 NOTE — [facility] Group Notes (Signed)
[facility] LCSW Group Therapy  12/29/2015 2:27 PM [facility] LCSW Group Therapy Note   Type of Therapy/Topic:  Group Therapy:  Emotion Regulation  Participation Level:  Good participation   Mood:  Reports Good mood  Description of Group:    The purpose of this group is to assist patients in learning to regulate negative emotions and experience positive emotions. Patients will be guided to discuss ways in which they have been vulnerable to their negative emotions. These vulnerabilities will be juxtaposed with experiences of positive emotions or situations, and patients challenged to use positive emotions to combat negative ones. Special emphasis will be placed on coping with negative emotions in conflict situations, and patients will process healthy conflict resolution skills.  Therapeutic Goals: 1. Patient will identify two positive emotions or experiences to reflect on in order to balance out negative emotions:  2. Patient will label two or more emotions that they find the most difficult to experience:  3. Patient will be able to demonstrate positive conflict resolution skills through discussion or role plays:   Summary of Patient Progress:  Pt participated and able to meet therapeutic goals however thoughts are disorganized and at times difficult to follow.  Pt verbalizes fear as a difficult emotion and that she goes through great efforts to appear strong and as if she can handle things hiding her true feelings of being overwhelmed and afraid.   Therapeutic Modalities:   Cognitive Behavioral Therapy Feelings Identification Dialectical Behavioral Therapy   Jodi Stephens, MSW, LCSW 12/29/2015, 2:27 PM
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
## 2015-12-29 NOTE — Progress Notes (Signed)
D: Observed pt in room lying down. Patient alert and oriented x4. Patient denies SI/HI/AVH. Pt affect is flat. Pt appeared very drowsy initially. Pt was later seen in the dayroom interacting with peers. Pt stated "trying to overcome fear of people." Pt stated she was "less fearful." Pt rated depression 1/10 and anxiety 0/10. Pt endorsed fear of: "people judging me, not seeing my family, losing weight." A: Offered active listening and support. Provided therapeutic communication. Administered scheduled medications. Encouraged pt to continue interacting with peers and staff, R: Pt pleasant and cooperative. Pt medication compliant. Will continue Q15 min. checks. Safety maintained.

## 2015-12-29 NOTE — [facility] Group Notes (Signed)
[facility] Group Notes:  (Nursing/MHT/Case Management/Adjunct)  Date:  12/29/2015  Time:  6:03 AM  Type of Therapy:  Group Therapy  Participation Level:  Active  Participation Quality:  Appropriate  Affect:  Appropriate  Cognitive:  Appropriate  Insight:  Appropriate  Engagement in Group:  Engaged  Modes of Intervention:  Discussion  Summary of Progress/Problems:  Jodi [name] 12/29/2015, 6:03 AM

## 2015-12-29 NOTE — [facility] Suicide Risk Assessment (Addendum)
[facility]   Nursing information obtained from:    Demographic factors:    Current Mental Status:    Loss Factors:    Historical Factors:    Risk Reduction Factors:     Total Time spent with patient: 1 hour Principal Problem: Bipolar affective disorder, mixed, severe, with psychotic behavior (HCC) Diagnosis:   Patient Active Problem List   Diagnosis Date Noted  . Cannabis use disorder, severe, dependence (HCC) [F12.20] 12/29/2015  . Hyperprolactinemia (HCC) [E22.1] 12/29/2015  . Tobacco use disorder [F17.200] 12/28/2015  . Bipolar affective disorder, mixed, severe, with psychotic behavior (HCC) [F31.64] 12/26/2015   Subjective Data:   Continued Clinical Symptoms:  Alcohol Use Disorder Identification Test Final Score (AUDIT): 0 The "Alcohol Use Disorders Identification Test", Guidelines for Use in Primary Care, Second Edition.  World Science writerHealth Organization [facility](WHO). Score between 0-7:  no or low risk or alcohol related problems. Score between 8-15:  moderate risk of alcohol related problems. Score between 16-19:  high risk of alcohol related problems. Score 20 or above:  warrants further diagnostic evaluation for alcohol dependence and treatment.   CLINICAL FACTORS:   Severe Anxiety and/or Agitation Alcohol/Substance Abuse/Dependencies Currently Psychotic Previous Psychiatric Diagnoses and Treatments    Psychiatric Specialty Exam: Physical Exam  ROS  Blood pressure (!) 129/94, pulse (!) 101, temperature 97.8 F (36.6 C), temperature source Oral, resp. rate 18, height 5\' 5"  (1.651 m), weight 59 kg (130 lb), last menstrual period 11/24/2015, SpO2 100 %.Body mass index is 21.63 kg/m.                                                    Sleep:  Number of Hours: 7.75      COGNITIVE FEATURES THAT CONTRIBUTE TO RISK:  Loss of executive function    SUICIDE RISK:   Moderate:  Frequent suicidal ideation with limited intensity, and  duration, some specificity in terms of plans, no associated intent, good self-control, limited dysphoria/symptomatology, some risk factors present, and identifiable protective factors, including available and accessible social support.   [facility]  I certify that inpatient services furnished can reasonably be expected to improve the patient's condition.  Jimmy FootmanHernandez-Gonzalez,  Aiyanah Kalama, MD 12/29/2015, 1:05 PM

## 2015-12-30 ENCOUNTER — Inpatient Hospital Stay: Payer: No Typology Code available for payment source

## 2015-12-30 LAB — URINALYSIS COMPLETE WITH MICROSCOPIC (ARMC ONLY)
BACTERIA UA: NONE SEEN
Bilirubin Urine: NEGATIVE
Glucose, UA: NEGATIVE mg/dL
Ketones, ur: NEGATIVE mg/dL
NITRITE: NEGATIVE
PH: 7 (ref 5.0–8.0)
PROTEIN: NEGATIVE mg/dL
SPECIFIC GRAVITY, URINE: 1.004 — AB (ref 1.005–1.030)

## 2015-12-30 MED ORDER — ARIPIPRAZOLE 10 MG PO TABS
20.0000 mg | ORAL_TABLET | Freq: Every day | ORAL | Status: DC
  Administered 2015-12-31 – 2016-01-04 (×5): 20 mg via ORAL
  Filled 2015-12-30 (×5): qty 2

## 2015-12-30 NOTE — [facility] Group Notes (Signed)
[facility] Group Notes:  (Nursing/MHT/Case Management/Adjunct)  Date:  12/30/2015  Time:  4:13 PM  Type of Therapy:  Psychoeducational Skills  Participation Level:  Active  Participation Quality:  Redirectable  Affect:  Flat  Cognitive:  Lacking  Insight:  Limited  Engagement in Group:  Limited  Modes of Intervention:  Discussion and Education  Summary of Progress/Problems:  Jodi Stephens [name] 12/30/2015, 4:13 PM

## 2015-12-30 NOTE — Progress Notes (Signed)
D: Observed pt in room Patient alert and oriented x4. Patient denies SI/HI/AVH. Pt affect is flat. Pt stated her day was "really good" and her mood is "neutral." Pt interacting well with peers and attending group. Pt endorsed being sad "because I'm here." Pt endorsed fears about MRI. A: Offered active listening and support. Provided therapeutic communication. Administered scheduled medications. Educated pt on the process of undergoing MRI. R: Pt pleasant and cooperative. Pt stated she felt "less scared" about MRI after education. Pt medication compliant. Will continue Q15 min. checks. Safety maintained.

## 2015-12-30 NOTE — [facility] Group Notes (Signed)
[facility] LCSW Group Therapy  12/30/2015 2:49 PM  Type of Therapy:  Group Therapy  Participation Level:  Active  Participation Quality:  Attentive  Affect:  Appropriate and Flat  Cognitive:  Alert  Insight:  Improving  Engagement in Therapy:  Limited  Modes of Intervention:  Discussion, Education and Support  Summary of Progress/Problems:Balance in life: Patients will discuss the concept of balance and how it looks and feels to be unbalanced. Pt will identify areas in their life that is unbalanced and ways to become more balanced. Pt was engaged during group and stated she needed to maintain sobriety to remain balanced in life.   Jodi Stephens G. Garnette CzechSampson MSW, LCSWA 12/30/2015, 3:04 PM

## 2015-12-30 NOTE — Progress Notes (Signed)
[facility]  12/30/2015 9:32 AM Jodi Stephens  MRN:  [id number] Subjective:  The patient is a 25 year old single [place].  Patient has a history of bipolar disorder and was hospitalized in our facility a few years back.  The patient presented to our emergency department with family complaining of racing thoughts, insomnia, visual hallucinations of God, decreased need for sleep, elevated energy and feelings of going "crazy".  Patient states that after her hospitalization 3 years ago (actually it was last year) she follow up briefly at [facility] ([facility]).  Patient reports completing 3 months of intensive outpatient substance abuse which she received due to her issues with abusing alcohol and marijuana. Patient stated that she was told it was okay for her to discontinue her medications. Patient has been without medications for about a year.  During assessment the patient reported sleeping, and eating well. She denies major problems with mood, appetite, energy or concentration. Denies suicidality, homicidality or having auditory or visual hallucinations. She was seen singing loudly and pacing the hallways. The nurses report that she has been quite disorganized today and was disruptive in groups.  I was trying to his plane to the patient that her prolactin levels were elevated, was trying to educate her about what prolactin was. She tells me her prolactin is elevated because she is in love with a guy.  Per nursing: D: Observed pt in room Patient alert and oriented x4. Patient denies SI/HI/AVH. Pt affect is flat. Pt stated her day was "really good" and her mood is "neutral." Pt interacting well with peers and attending group. Pt endorsed being sad "because I'm here." Pt endorsed fears about MRI. A: Offered active listening and support. Provided therapeutic communication. Administered scheduled medications. Educated  pt on the process of undergoing MRI.  Principal Problem: Bipolar affective disorder, mixed, severe, with psychotic behavior (HCC) Diagnosis:   Patient Active Problem List   Diagnosis Date Noted  . Cannabis use disorder, severe, dependence (HCC) [F12.20] 12/29/2015  . Hyperprolactinemia (HCC) [E22.1] 12/29/2015  . Tobacco use disorder [F17.200] 12/28/2015  . Bipolar affective disorder, mixed, severe, with psychotic behavior (HCC) [F31.64] 12/26/2015   Total Time spent with patient: 30 minutes  Past Psychiatric History: Patient was hospitalized in our unit in February of last year."[name] was brought to the Emergency Room by her family. She has been depressed, dysfunctional, and behaving in a bizarre way with confused, disorganized, psychotic thinking. The patient is not able to provide much detail. She does admit that she has many symptoms of depression with poor sleep, decreased appetite, and 10 pound weight loss, anhedonia, feeling of guilt, hopelessness, worthlessness, poor energy and concentration, crying spells, social isolation. She reports that she has been thinking of suicide for quite sometime, but is vague about trying something". She was discharged on Risperdal and prozac.  It looks like after discharge and follow-up at [facility] only briefly but has been without medications for about a year.   Past Medical History: History reviewed. No pertinent past medical history. History reviewed. No pertinent surgical history.   Family History: History reviewed. No pertinent family history.   Family Psychiatric  History: Patient reports having an aunt with schizophrenia. Her father was an alcoholic. There is no suicides in her family. There is an aunt who committed homicide  Social History: Patient is single, never married, doesn't have any children, she graduated from high school. 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 She lives by  herself in an apartment in [place]. She recently moved back with her parents. She denies any legal history History  Alcohol Use  . Yes     History  Drug Use  . Types: Marijuana    Social History   Social History  . Marital status: Single    Spouse name: N/A  . Number of children: N/A  . Years of education: N/A   Social History Main Topics  . Smoking status: Current Some Day Smoker  . Smokeless tobacco: Never Used  . Alcohol use Yes  . Drug use:     Types: Marijuana  . Sexual activity: Not Asked   Other Topics Concern  . None   Social History Narrative  . None    Current Medications: Current Facility-Administered Medications  Medication Dose Route Frequency Provider Last Rate Last Dose  . acetaminophen (TYLENOL) tablet 650 mg  650 mg Oral Q6H PRN Audery Amel, MD      . alum & mag hydroxide-simeth (MAALOX/MYLANTA) 200-200-20 MG/5ML suspension 30 mL  30 mL Oral Q4H PRN Audery Amel, MD      . Melene Muller ON 12/31/2015] ARIPiprazole (ABILIFY) tablet 20 mg  20 mg Oral Daily Jimmy Footman, MD      . LORazepam (ATIVAN) tablet 1 mg  1 mg Oral QHS Jimmy Footman, MD   1 mg at 12/29/15 2148  . LORazepam (ATIVAN) tablet 2 mg  2 mg Oral Q4H PRN Audery Amel, MD      . magnesium hydroxide (MILK OF MAGNESIA) suspension 30 mL  30 mL Oral Daily PRN Audery Amel, MD        Lab Results:  Results for orders placed or performed during the hospital encounter of 12/28/15 (from the past 48 hour(s))  Hemoglobin A1c     Status: None   Collection Time: 12/28/15  5:56 PM  Result Value Ref Range   Hgb A1c MFr Bld 5.1 4.0 - 6.0 %  Lipid panel, fasting     Status: None   Collection Time: 12/28/15  5:56 PM  Result Value Ref Range   Cholesterol 151 0 - 200 mg/dL   Triglycerides 61 <161 mg/dL   HDL 72 >09 mg/dL   Total CHOL/HDL Ratio 2.1 RATIO   VLDL 12 0 - 40 mg/dL   LDL Cholesterol 67 0 - 99 mg/dL    Comment:        Total Cholesterol/HDL:CHD Risk Coronary  Heart Disease Risk Table                     Men   Women  1/2 Average Risk   3.4   3.3  Average Risk       5.0   4.4  2 X Average Risk   9.6   7.1  3 X Average Risk  23.4   11.0        Use the calculated Patient Ratio above and the CHD Risk Table to determine the patient's CHD Risk.        ATP III CLASSIFICATION (LDL):  <100     mg/dL   Optimal  604-540  mg/dL   Near or Above                    Optimal  130-159  mg/dL   Borderline  981-191  mg/dL   High  >478  mg/dL   Very High   Prolactin     Status: Abnormal   Collection Time: 12/28/15  5:56 PM  Result Value Ref Range   Prolactin 145.4 (H) 4.8 - 23.3 ng/mL    Comment: (NOTE) Performed At: [facility] [address] [place], [place] [id number] Mila HomerHancock William F MD WU:[id number]:[phone]   TSH     Status: None   Collection Time: 12/28/15  5:56 PM  Result Value Ref Range   TSH 0.370 0.350 - 4.500 uIU/mL  Urinalysis complete, with microscopic ([facility] only)     Status: Abnormal   Collection Time: 12/29/15  7:45 PM  Result Value Ref Range   Color, Urine STRAW (A) YELLOW   APPearance CLEAR (A) CLEAR   Glucose, UA NEGATIVE NEGATIVE mg/dL   Bilirubin Urine NEGATIVE NEGATIVE   Ketones, ur NEGATIVE NEGATIVE mg/dL   Specific Gravity, Urine 1.004 (L) 1.005 - 1.030   Hgb urine dipstick 3+ (A) NEGATIVE   pH 7.0 5.0 - 8.0   Protein, ur NEGATIVE NEGATIVE mg/dL   Nitrite NEGATIVE NEGATIVE   Leukocytes, UA 2+ (A) NEGATIVE   RBC / HPF 0-5 0 - 5 RBC/hpf   WBC, UA 0-5 0 - 5 WBC/hpf   Bacteria, UA NONE SEEN NONE SEEN   Squamous Epithelial / LPF 0-5 (A) NONE SEEN    Blood Alcohol level:  Lab Results  Component Value Date   ETH <5 12/26/2015    Metabolic Disorder Labs: Lab Results  Component Value Date   HGBA1C 5.1 12/28/2015   Lab Results  Component Value Date   PROLACTIN 145.4 (H) 12/28/2015   Lab Results  Component Value Date   CHOL 151 12/28/2015   TRIG 61 12/28/2015   HDL 72 12/28/2015   CHOLHDL 2.1 12/28/2015    VLDL 12 12/28/2015   LDLCALC 67 12/28/2015   LDLCALC 57 06/18/2014    Physical Findings: AIMS:  , ,  ,  ,    CIWA:    COWS:     Musculoskeletal: Strength & Muscle Tone: within normal limits Gait & Station: normal Patient leans: N/A  Psychiatric Specialty Exam: Physical Exam  Constitutional: She is oriented to person, place, and time. She appears well-developed and well-nourished.  HENT:  Head: Normocephalic and atraumatic.  Eyes: EOM are normal.  Neck: Normal range of motion.  Respiratory: Effort normal.  Musculoskeletal: Normal range of motion.  Neurological: She is alert and oriented to person, place, and time.  Skin: Skin is dry.    Review of Systems  Constitutional: Negative.   HENT: Negative.   Eyes: Negative.   Respiratory: Negative.   Cardiovascular: Negative.   Gastrointestinal: Negative.   Genitourinary: Negative.   Musculoskeletal: Negative.   Skin: Negative.   Neurological: Negative.   Endo/Heme/Allergies: Negative.   Psychiatric/Behavioral: Negative.     Blood pressure (!) 134/92, pulse 100, temperature 98.2 F (36.8 C), temperature source Oral, resp. rate 18, height 5\' 5"  (1.651 m), weight 59 kg (130 lb), last menstrual period 11/24/2015, SpO2 100 %.Body mass index is 21.63 kg/m.  General Appearance: Fairly Groomed  Eye Contact:  Good  Speech:  Normal Rate  Volume:  Decreased  Mood:  Dysphoric  Affect:  Flat  Thought Process:  Irrelevant and Descriptions of Associations: Loose  Orientation:  Full (Time, Place, and Person)  Thought Content:  Hallucinations: None  Suicidal Thoughts:  No  Homicidal Thoughts:  No  Memory:  Immediate;   Poor Recent;   Poor Remote;   Poor  Judgement:  Impaired  Insight:  Lacking  Psychomotor Activity:  Increased  Concentration:  Concentration: Poor and Attention Span: Poor  Recall:  Poor  Fund of Knowledge:  Fair  Language:  Good  Akathisia:  No  Handed:    AIMS (if indicated):     Assets:  Investment banker, corporate Physical Health Social Support  ADL's:  Intact  Cognition:  WNL  Sleep:  Number of Hours: 7     Treatment Plan Summary:  Patient is presenting with symptoms of psychosis and mania. She has been given a prior diagnosis of bipolar disorder however not a potential diagnosis could be schizoaffective disorder bipolar type  For bipolar disorder the patient has been started on abilify. Will increase dose to 20 mg today. This medication will be chosen as her prolactin level is highly elevated at 145  For insomnia: continue Ativan 1 mg by mouth by mouth daily at bedtime  For agitation: continue Ativan 2 mg by mouth every 4 hours as needed  Tobacco use disorder: Patient declines from receiving nicotine patch  Cannabis use disorder and past history of alcohol use: Patient continues to be in need of substance abuse treatment. Will refer to outpatient substance abuse upon discharge  Hyperprolactinemia her prolactin is 145 despite her noncompliance with antipsychotics. I will order an MRI with contrast to rule out a pituitary adenoma (test is pending)  Tachycardia: EKG yesterday: Normal sinus rhythm with sinus arrhythmia.Normal ECG. HR 79  UTI?: 2+ wbc but no symptoms.  Will order urine culture  Labs hemoglobin A1c, prolactin, lipid panel have been ordered. TSH is within the normal limits. Urine pregnancy neg  Disposition was stable she will be discharged back to her parents  Follow-up to be determined.   Jimmy Footman, MD 12/30/2015, 9:32 AM

## 2015-12-30 NOTE — Plan of Care (Signed)
Problem: Coping: Goal: Ability to cope will improve Outcome: Progressing Attending unit programing  , encourage to verbalize feelings

## 2015-12-30 NOTE — Plan of Care (Signed)
Problem: Education: Goal: Will be free of psychotic symptoms Outcome: Progressing Pt no exhibiting any psychotic symptoms.

## 2015-12-30 NOTE — Progress Notes (Signed)
D: Patient note to have difficulty   with focus . Noted in group flight of ideas  Patient stated slept good last night .Stated appetite is good and energy level  Is normal. Stated concentration is good . Stated on Depression scale 1 , hopeless 0 and anxiety 0 .( low 0-10 high) Denies suicidal  homicidal ideations  .  No auditory hallucinations  No pain concerns . Appropriate ADL'S. Interacting with peers and staff. Patient  A: Encourage patient participation with unit programming . Instruction  Given on  Medication , verbalize understanding. R: Voice no other concerns. Staff continue to monitor

## 2015-12-30 NOTE — [facility] Group Notes (Signed)
[facility] LCSW Group Therapy Note  Type of Therapy and Topic:  Group Therapy:  Goals Group: SMART Goals  Participation Level:  Patient attended group on this date. Patient participated in goal setting and was able to share openly with the group.   Description of Group:    The purpose of a daily goals group is to assist and guide patients in setting recovery/wellness-related goals.  The objective is to set goals as they relate to the crisis in which they were admitted. Patients will be using SMART goal modalities to set measurable goals.  Characteristics of realistic goals will be discussed and patients will be assisted in setting and processing how one will reach their goal. Facilitator will also assist patients in applying interventions and coping skills learned in psycho-education groups to the SMART goal and process how one will achieve defined goal.  Therapeutic Goals: -Patients will develop and document one goal related to or their crisis in which brought them into treatment. -Patients will be guided by LCSW using SMART goal setting modality in how to set a measurable, attainable, realistic and time sensitive goal.  -Patients will process barriers in reaching goal. -Patients will process interventions in how to overcome and successful in reaching goal.   Summary of Patient Progress:  Patient Goal: Patient stated her goal was to control her negative thoughts by taking medications and developing coping skills to help with her depression and anxiety.     Therapeutic Modalities:   Motivational Interviewing  Engineer, manufacturing systemsCognitive Behavioral Therapy Crisis Intervention Model SMART goals setting  Selvin Yun G. Garnette CzechSampson MSW, [facility] 12/30/2015 11:24 AM

## 2015-12-30 NOTE — [facility] Suicide Risk Assessment (Signed)
[facility] INPATIENT:  Family/Significant Other Suicide Prevention Education  Suicide Prevention Education:  Education Completed; father,  Morrell RiddleMalcolm Nile ph#: [phone] has been identified by the patient as the family member/significant other with whom the patient will be residing, and identified as the person(s) who will aid the patient in the event of a mental health crisis (suicidal ideations/suicide attempt).  With written consent from the patient, the family member/significant other has been provided the following suicide prevention education, prior to the and/or following the discharge of the patient. Pt's father is invested in pt's aftercare plans, stated that family will be heavily involved and thinks pt's will benefit from a psychiatrist and therapist.  The suicide prevention education provided includes the following:  Suicide risk factors  Suicide prevention and interventions  National Suicide Hotline telephone number  [facility] assessment telephone number  [facility] Emergency Assistance 911  [facility] and/or Residential Mobile Crisis Unit telephone number  Request made of family/significant other to:  Remove weapons (e.g., guns, rifles, knives), all items previously/currently identified as safety concern.    Remove drugs/medications (over-the-counter, prescriptions, illicit drugs), all items previously/currently identified as a safety concern.  The family member/significant other verbalizes understanding of the suicide prevention education information provided.  The family member/significant other agrees to remove the items of safety concern listed above.  Lynden OxfordKadijah R Aubriee Szeto, MSW, LCSW-A 12/30/2015, 9:27 AM

## 2015-12-30 NOTE — Progress Notes (Signed)
Recreation Therapy Notes  Date: 08.17.17 Time: 9:30 am Location: Craft Room  Group Topic: Leisure Education  Goal Area(s) Addresses:  Patient will identify things they are grateful for.  Patient will be educated on why it is important to be grateful.  Behavioral Response: Attentive, Interactive  Intervention: Grateful Wheel  Activity: Patients were given an I Am Grateful For worksheet and instructed to write things they are grateful for under each category.  Education: LRT educated patients on why it is important to be grateful.  Education Outcome: In group clarification offered   Clinical Observations/Feedback: Patient completed activity by writing things she was grateful for under each category. Patient contributed to group discussion by stating things she was grateful for. Patient was focused on peer and why peer was mad at her. Patient talked about her parents and how she gets sad when she thinks about them.  Jacquelynn CreeGreene,Intisar Claudio M, LRT/CTRS 12/30/2015 10:31 AM

## 2015-12-31 ENCOUNTER — Inpatient Hospital Stay: Payer: No Typology Code available for payment source

## 2015-12-31 MED ORDER — GADOBENATE DIMEGLUMINE 529 MG/ML IV SOLN
10.0000 mL | Freq: Once | INTRAVENOUS | Status: AC | PRN
  Administered 2015-12-31: 6 mL via INTRAVENOUS

## 2015-12-31 MED ORDER — LORAZEPAM 2 MG PO TABS
2.0000 mg | ORAL_TABLET | Freq: Every day | ORAL | Status: DC
  Administered 2015-12-31: 2 mg via ORAL
  Filled 2015-12-31: qty 1

## 2015-12-31 NOTE — [facility] Group Notes (Signed)
[facility] Group Notes:  (Nursing/MHT/Case Management/Adjunct)  Date:  12/31/2015  Time:  4:22 PM  Type of Therapy:  Psychoeducational Skills  Participation Level:  Minimal  Participation Quality:  Appropriate and Attentive  Affect:  Flat  Cognitive:  Appropriate  Insight:  Appropriate  Engagement in Group:  Engaged and Supportive  Modes of Intervention:  Discussion and Education  Summary of Progress/Problems:  Jodi Stephens 12/31/2015, 4:22 PM

## 2015-12-31 NOTE — Progress Notes (Signed)
Patient is pleasant & cooperative.Denies suicidal or homicidal ideations & AV hallucinations.Appropriate with staff & peers.Compliant with medications.Attended groups.

## 2015-12-31 NOTE — Tx Team (Signed)
Interdisciplinary Treatment Plan Update (Adult)         Date: 12/31/2015   Time Reviewed: 10:30 AM   Progress in Treatment: Improving Attending groups: Yes  Participating in groups: Yes  Taking medication as prescribed: Yes  Tolerating medication: Yes  Family/Significant other contact made: CSW spoke with dad, Norberto Sorenson Patient understands diagnosis: Yes  Discussing patient identified problems/goals with staff: Yes  Medical problems stabilized or resolved: Yes  Denies suicidal/homicidal ideation: Yes  Issues/concerns per patient self-inventory: Yes  Other:   New problem(s) identified: N/A   Discharge Plan or Barriers: see below   Reason for Continuation of Hospitalization:   Depression   Anxiety   Medication Stabilization   Comments: N/A   Estimated length of stay: 3-5 days    Patient is a 25 year old female admitted for psychosis. Patient lives in [place], [place].  25 year old woman brought in to the emergency room presumably by her family because of worsening psychosis. Most of the history obtained from the patient. She says that for some period of time probably more than a week her mood has been very bad. She has a hard time describing it but it sounds like it's been a combination of depressed and angry. She says she hasn't slept in a long time probably over a week. Her appetite has been poor. She describes having auditory hallucinations saying that there are voices telling her to kill her self. All of this makes her history sound more organized than it is. The patient is very disorganized in her thinking  Patient will benefit from crisis stabilization, medication evaluation, group therapy, and psycho education in addition to case management for discharge planning. Patient and CSW reviewed pt's identified goals and treatment plan. Pt verbalized understanding and agreed to treatment plan.    Review of initial/current patient goals per problem list:  1. Goal(s): Patient will  participate in aftercare plan   Met: Goal progressing   Target date: 3-5 days post admission date   As evidenced by: Patient will participate within aftercare plan AEB aftercare provider and housing plan at discharge being identified.   CSW is assessing proper aftercare plans.   2. Goal (s): Patient will exhibit decreased depressive symptoms and suicidal ideations.   Met: Goal progressing   Target date: 3-5 days post admission date   As evidenced by: Patient will utilize self-rating of depression at 3 or below and demonstrate decreased signs of depression or be deemed stable for discharge by MD.   Patient reports a depression score of 5 at this time. Denies SI at this time.  3. Goal(s): Patient will demonstrate decreased signs and symptoms of anxiety.   Met: Goal progressing   Target date: 3-5 days post admission date   As evidenced by: Patient will utilize self-rating of anxiety at 3 or below and demonstrated decreased signs of anxiety, or be deemed stable for discharge by MD   Reports an anxiety score of 4 at this time.    5. Goal(s): Patient will demonstrate decreased signs of psychosis  * Met: Yes * Target date: 3-5 days post admission date  * As evidenced by: Patient will demonstrate decreased frequency of AVH or return to baseline function   Denies AVH at this time.   Patient: Jodi Stephens Family:  Physician: Merlyn Albert, MD    12/31/2015 10:30AM  Nursing: Polly Cobia, RN     12/31/2015 10:30AM  Clinical Social Worker: Emilie Rutter, LCSWA  12/31/2015 10:30AM  Other: Everitt Amber, Recreational  Therapist  12/31/2015 10:30AM

## 2015-12-31 NOTE — Progress Notes (Signed)
D: Patient appears somewhat disorganized and flat. Attends group. Visible in the milieu. Denies SI/HI/AVH at this time.  A: Medication given with education. Encouragement provided.  R: Patient was compliant with medication. She remains calm and cooperative. Safety maintained with 15 min checks.

## 2015-12-31 NOTE — [facility] Group Notes (Signed)
[facility] LCSW Group Therapy  12/31/2015 11:15 AM  Type of Therapy:  Group Therapy  Participation Level:  Pt did not attend group. CSW invited pt to group.   Summary of Progress/Problems:Feelings around Relapse. Group members discussed the meaning of relapse and shared personal stories of relapse, how it affected them and others, and how they perceived themselves during this time. Group members were encouraged to identify triggers, warning signs and coping skills used when facing the possibility of relapse. Social supports were discussed and explored in detail. Patients also discussed facing disappointment and how that can trigger someone to relapse.   Jodi Stephens MSW, LCSWA 12/31/2015, 11:15 AM

## 2015-12-31 NOTE — Progress Notes (Signed)
[facility]  12/31/2015 1:23 PM Jodi PeachMercy Larae Stephens  MRN:  [id number] Subjective:  The patient is a 25 year old single [place].  Patient has a history of bipolar disorder and was hospitalized in our facility a few years back.  The patient presented to our emergency department with family complaining of racing thoughts, insomnia, visual hallucinations of God, decreased need for sleep, elevated energy and feelings of going "crazy".  Patient states that after her hospitalization 3 years ago (actually it was last year) she follow up briefly at [facility] (patient calls it [facility]).  Patient reports completing 3 months of intensive outpatient substance abuse which she received due to her issues with abusing alcohol and marijuana. Patient stated that she was told it was okay for her to discontinue her medications. Patient has been without medications for about a year.  During assessment the patient reported sleeping, and eating well. She denies major problems with mood, appetite, energy or concentration. Denies suicidality, homicidality or having auditory or visual hallucinations. She became tearful when discussing discharge, pt was hoping to be d/c home today. Yesterday she was singing loudly and pacing the hallways. The nurses reported that she has been quite disorganized today and was disruptive in groups.  Only slept 4 h last night.  Has been complaint with meds, oral intake wnl.  Per nursing: D: Patient appears somewhat disorganized and flat. Attends group. Visible in the milieu. Denies SI/HI/AVH at this time.  A: Medication given with education. Encouragement provided.  R: Patient was compliant with medication. She remains calm and cooperative. Safety maintained with 15 min checks.   Principal Problem: Bipolar affective disorder, mixed, severe, with psychotic behavior (HCC) Diagnosis:   Patient Active Problem List   Diagnosis  Date Noted  . Cannabis use disorder, severe, dependence (HCC) [F12.20] 12/29/2015  . Hyperprolactinemia (HCC) [E22.1] 12/29/2015  . Tobacco use disorder [F17.200] 12/28/2015  . Bipolar affective disorder, mixed, severe, with psychotic behavior (HCC) [F31.64] 12/26/2015   Total Time spent with patient: 30 minutes  Past Psychiatric History: Patient was hospitalized in our unit in February of last year."Jodi Stephens was brought to the Emergency Room by her family. She has been depressed, dysfunctional, and behaving in a bizarre way with confused, disorganized, psychotic thinking. The patient is not able to provide much detail. She does admit that she has many symptoms of depression with poor sleep, decreased appetite, and 10 pound weight loss, anhedonia, feeling of guilt, hopelessness, worthlessness, poor energy and concentration, crying spells, social isolation. She reports that she has been thinking of suicide for quite sometime, but is vague about trying something". She was discharged on Risperdal and prozac.  It looks like after discharge and follow-up at [facility] behavior only briefly but has been without medications for about a year.   Past Medical History: History reviewed. No pertinent past medical history. History reviewed. No pertinent surgical history.   Family History: History reviewed. No pertinent family history.   Family Psychiatric  History: Patient reports having an aunt with schizophrenia. Her father was an alcoholic. There is no suicides in her family. There is an aunt who committed homicide  Social History: Patient is single, never married, doesn't have any children, she graduated from high school. 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 She [place] by herself [place]. She recently moved back with her parents. She denies any legal history History  Alcohol Use  . Yes     History  Drug Use  . Types: Marijuana     Social History   Social History  . Marital status: Single    Spouse name: N/A  . Number of children: N/A  . Years of education: N/A   Social History Main Topics  . Smoking status: Current Some Day Smoker  . Smokeless tobacco: Never Used  . Alcohol use Yes  . Drug use:     Types: Marijuana  . Sexual activity: Not Asked   Other Topics Concern  . None   Social History Narrative  . None    Current Medications: Current Facility-Administered Medications  Medication Dose Route Frequency Provider Last Rate Last Dose  . acetaminophen (TYLENOL) tablet 650 mg  650 mg Oral Q6H PRN Audery AmelJohn T Clapacs, MD      . alum & mag hydroxide-simeth (MAALOX/MYLANTA) 200-200-20 MG/5ML suspension 30 mL  30 mL Oral Q4H PRN Audery AmelJohn T Clapacs, MD      . ARIPiprazole (ABILIFY) tablet 20 mg  20 mg Oral Daily Jimmy FootmanAndrea Hernandez-Gonzalez, MD   20 mg at 12/31/15 1038  . LORazepam (ATIVAN) tablet 2 mg  2 mg Oral Q4H PRN Audery AmelJohn T Clapacs, MD      . LORazepam (ATIVAN) tablet 2 mg  2 mg Oral QHS Jimmy FootmanAndrea Hernandez-Gonzalez, MD      . magnesium hydroxide (MILK OF MAGNESIA) suspension 30 mL  30 mL Oral Daily PRN Audery AmelJohn T Clapacs, MD        Lab Results:  Results for orders placed or performed during the hospital encounter of 12/28/15 (from the past 48 hour(s))  Urinalysis complete, with microscopic ([facility] only)     Status: Abnormal   Collection Time: 12/29/15  7:45 PM  Result Value Ref Range   Color, Urine STRAW (A) YELLOW   APPearance CLEAR (A) CLEAR   Glucose, UA NEGATIVE NEGATIVE mg/dL   Bilirubin Urine NEGATIVE NEGATIVE   Ketones, ur NEGATIVE NEGATIVE mg/dL   Specific Gravity, Urine 1.004 (L) 1.005 - 1.030   Hgb urine dipstick 3+ (A) NEGATIVE   pH 7.0 5.0 - 8.0   Protein, ur NEGATIVE NEGATIVE mg/dL   Nitrite NEGATIVE NEGATIVE   Leukocytes, UA 2+ (A) NEGATIVE   RBC / HPF 0-5 0 - 5 RBC/hpf   WBC, UA 0-5 0 - 5 WBC/hpf   Bacteria, UA NONE SEEN NONE SEEN   Squamous Epithelial / LPF 0-5 (A) NONE SEEN    Blood Alcohol  level:  Lab Results  Component Value Date   ETH <5 12/26/2015    Metabolic Disorder Labs: Lab Results  Component Value Date   HGBA1C 5.1 12/28/2015   Lab Results  Component Value Date   PROLACTIN 145.4 (H) 12/28/2015   Lab Results  Component Value Date   CHOL 151 12/28/2015   TRIG 61 12/28/2015   HDL 72 12/28/2015   CHOLHDL 2.1 12/28/2015   VLDL 12 12/28/2015   LDLCALC 67 12/28/2015   LDLCALC 57 06/18/2014    Physical Findings: AIMS:  , ,  ,  ,    CIWA:    COWS:     Musculoskeletal: Strength & Muscle Tone: within normal limits Gait & Station: normal Patient leans: N/A  Psychiatric Specialty Exam: Physical Exam  Constitutional: She is oriented to person, place, and time. She appears well-developed and well-nourished.  HENT:  Head: Normocephalic and atraumatic.  Eyes: EOM are normal.  Neck: Normal range of motion.  Respiratory: Effort normal.  Musculoskeletal: Normal  range of motion.  Neurological: She is alert and oriented to person, place, and time.  Skin: Skin is dry.    Review of Systems  Constitutional: Negative.   HENT: Negative.   Eyes: Negative.   Respiratory: Negative.   Cardiovascular: Negative.   Gastrointestinal: Negative.   Genitourinary: Negative.   Musculoskeletal: Negative.   Skin: Negative.   Neurological: Negative.   Endo/Heme/Allergies: Negative.   Psychiatric/Behavioral: Negative.     Blood pressure 131/88, pulse 93, temperature 98.2 F (36.8 C), temperature source Oral, resp. rate 18, Stephens 5\' 5"  (1.651 m), weight 59 kg (130 lb), last menstrual period 11/24/2015, SpO2 100 %.Body mass index is 21.63 kg/m.  General Appearance: Fairly Groomed  Eye Contact:  Good  Speech:  Normal Rate  Volume:  Decreased  Mood:  Dysphoric  Affect:  Flat  Thought Process:  Irrelevant and Descriptions of Associations: Loose  Orientation:  Full (Time, Place, and Person)  Thought Content:  Hallucinations: None  Suicidal Thoughts:  No  Homicidal  Thoughts:  No  Memory:  Immediate;   Poor Recent;   Poor Remote;   Poor  Judgement:  Impaired  Insight:  Lacking  Psychomotor Activity:  Increased  Concentration:  Concentration: Poor and Attention Span: Poor  Recall:  Poor  Fund of Knowledge:  Fair  Language:  Good  Akathisia:  No  Handed:    AIMS (if indicated):     Assets:  Manufacturing systems engineer Physical Health Social Support  ADL's:  Intact  Cognition:  WNL  Sleep:  Number of Hours: 4     Treatment Plan Summary:  Patient is presenting with symptoms of psychosis and mania. She has been given a prior diagnosis of bipolar disorder however not a potential diagnosis could be schizoaffective disorder bipolar type  For bipolar disorder the patient has been started on abilify. Continue abilify 20 mg today. This medication will be chosen as her prolactin level is highly elevated at 145  For insomnia: continue Ativan but will increase to 2 mg po qhs as she is not sleeping well.    For agitation: continue Ativan 2 mg by mouth every 4 hours as needed  Tobacco use disorder: Patient declines from receiving nicotine patch  Cannabis use disorder and past history of alcohol use: Patient continues to be in need of substance abuse treatment. Will refer to outpatient substance abuse upon discharge  Hyperprolactinemia her prolactin is 145 despite her noncompliance with antipsychotics. MRI completed today was wnl.  Tachycardia: EKG  Normal sinus rhythm with sinus arrhythmia.Normal ECG. HR 79  UTI?: 2+ wbc but no symptoms.  Culture pending  Labs hemoglobin A1c, prolactin, lipid panel have been ordered. TSH is within the normal limits. Urine pregnancy neg  Disposition was stable she will be discharged back to her parents  Follow-up to be determined.   Jimmy Footman, MD 12/31/2015, 1:23 PM

## 2015-12-31 NOTE — Progress Notes (Signed)
Recreation Therapy Notes  Date: 08.18.17 Time: 1:00 pm Location: Craft Room  Group Topic: Self-expression, Coping Skills  Goal Area(s) Addresses:  Patient will effectively use art as a means of self-expression. Patient will recognize positive benefit of self-expression. Patient will be able to identify one emotion experienced during group session. Patient will identify use of art as a coping skill.  Behavioral Response: Attentive, Interactive  Intervention: Two Faces of Me  Activity: Patients were given a blank face worksheet and instructed to draw a line down the middle. On one side of the face, patients were instructed to draw or write how they felt when they were admitted to the hospital. On the other side of the face, patients were instructed to draw or write how they want to feel when they are d/c.  Education: LRT educated patients on healthy coping skills.  Education Outcome: Acknowledges education/In group clarification offered  Clinical Observations/Feedback: Patient completed activity by drawing how she felt when she was admitted and how she wanted to feel when she was d/c. Patient contributed to group discussion by stating how her faces were different.  Jacquelynn CreeGreene,Garyn Waguespack M, LRT/CTRS 12/31/2015 3:05 PM

## 2015-12-31 NOTE — [facility] Group Notes (Signed)
[facility] Group Notes:  (Nursing/MHT/Case Management/Adjunct)  Date:  12/31/2015  Time:  1:56 AM  Type of Therapy:  Evening Wrap-up Group  Participation Level:  Active  Participation Quality:  Appropriate and Attentive  Affect:  Appropriate  Cognitive:  Alert and Appropriate  Insight:  Appropriate, Good and Improving  Engagement in Group:  Developing/Improving and Engaged  Modes of Intervention:  Activity  Summary of Progress/Problems:  Jodi MorrowChelsea Nanta Kiam Bransfield 12/31/2015, 1:56 AM

## 2016-01-01 MED ORDER — TRAZODONE HCL 50 MG PO TABS
50.0000 mg | ORAL_TABLET | Freq: Every day | ORAL | Status: DC
  Administered 2016-01-01 – 2016-01-03 (×3): 50 mg via ORAL
  Filled 2016-01-01 (×3): qty 1

## 2016-01-01 MED ORDER — LORAZEPAM 0.5 MG PO TABS: 0.5000 mg | ORAL_TABLET | Freq: Four times a day (QID) | ORAL | Status: DC | PRN

## 2016-01-01 NOTE — [facility] Group Notes (Signed)
[facility] Group Notes:  (Nursing/MHT/Case Management/Adjunct)  Date:  01/01/2016  Time:  3:58 AM  Type of Therapy:  Psychoeducational Skills  Participation Level:  Active  Participation Quality:  Appropriate, Attentive and Sharing  Affect:  Appropriate  Cognitive:  Appropriate  Insight:  Appropriate and Good  Engagement in Group:  Engaged  Modes of Intervention:  Discussion, Socialization and Support  Summary of Progress/Problems:  Jodi MilroyLaquanda Y Stephens Ponder 01/01/2016, 3:58 AM

## 2016-01-01 NOTE — Plan of Care (Signed)
Problem: Coping: Goal: Ability to cope will improve Outcome: Progressing Patient using coping skills for anxiety [place] RN

## 2016-01-01 NOTE — Progress Notes (Signed)
Patient is pleasant & cooperative.Visible in the milieu.Appropriate with staff & peers.Rated her depression 0/10 and anxiety 4/10.Denies suicidal or homicidal ideations and AV hallucinations.Attended groups.Compliant with medications.

## 2016-01-01 NOTE — [facility] Group Notes (Signed)
[facility] LCSW Group Therapy  01/01/2016 2:42 PM  Type of Therapy:  Group Therapy  Participation Level:  Active  Participation Quality:  Appropriate  Affect:  Appropriate  Cognitive:  Alert  Insight:  Developing/Improving  Engagement in Therapy:  Engaged  Modes of Intervention:  Activity, Discussion, Education and Support  Summary of Progress/Problems:Self esteem: Patients discussed self esteem and how it impacts them. They discussed what aspects in their lives has influenced their self esteem. They were challenged to identify changes that are needed in order to improve self esteem. Patients participated in activity where they had to identify positive adjectives they felt described their personality. Patients shared with the group on the following areas: Things I am good at, What I like about my appearance, I've helped others by, What I value the most, compliments I have received, challenges I have overcome, thing that make me unique, and Times I've made others happy. Pt was willing to share openly with the group. Patient identified several positive traits about herself and stated her self-esteem has been improving since her admission to the unit.    Kaira Stringfield G. Garnette CzechSampson MSW, LCSWA 01/01/2016, 2:50 PM

## 2016-01-01 NOTE — Progress Notes (Signed)
D: Patient is alert and oriented on the unit this shift. Patient attended and actively participated in groups today. Patient denies suicidal ideation, homicidal ideation, auditory or visual hallucinations at the present time.  A: Scheduled medications are administered to patient as per MD orders. Emotional support and encouragement are provided. Patient is maintained on q.15 minute safety checks. Patient is informed to notify staff with questions or concerns. R: No adverse medication reactions are noted. Patient is cooperative with medication administration and treatment plan today. Patient is receptive,  anxious and cooperative on the unit at this time. Patient interacts  with others on the unit this shift Patient had conflict with another patient and was able to redirect self and keep calm . Patient contracts for safety at this time. Patient remains safe at this time.

## 2016-01-01 NOTE — Progress Notes (Signed)
[facility] MD Progress Note  01/01/2016 11:13 AM Jodi Stephens  MRN:  [id number] Subjective:  The patient is a 25 year old single African-American female from [facility] [place].  Patient has a history of bipolar disorder and was hospitalized in our facility a few years back.  The patient presented to our emergency department with family complaining of racing thoughts, insomnia, visual hallucinations of God, decreased need for sleep, elevated energy and feelings of going "crazy".  Patient states that after her hospitalization 3 years ago (actually it was last year) she follow up briefly at [facility] (patient calls it [facility]).  Patient reported that she came here after she wanted her medications were adjusted. Patient reported that she is feeling tired on the current dose of the medications. She has issues using alcohol and marijuana. Patient reported that medications are making her tired. She is interested in completing her treatment at this time. She has been without medications for about a year. Currently she denied having any suicidal homicidal ideations or plans.  She appeared calm and collected during the interview.  Per nursing: D: Patient appears somewhat disorganized and flat. Attends group. Visible in the milieu. Denies SI/HI/AVH at this time.  A: Medication given with education. Encouragement provided.  R: Patient was compliant with medication. She remains calm and cooperative. Safety maintained with 15 min checks.   Principal Problem: Bipolar affective disorder, mixed, severe, with psychotic behavior (HCC) Diagnosis:   Patient Active Problem List   Diagnosis Date Noted  . Cannabis use disorder, severe, dependence (HCC) [F12.20] 12/29/2015  . Hyperprolactinemia (HCC) [E22.1] 12/29/2015  . Tobacco use disorder [F17.200] 12/28/2015  . Bipolar affective disorder, mixed, severe, with psychotic behavior (HCC) [F31.64] 12/26/2015   Total Time spent with patient: 30  minutes  Past Psychiatric History: Patient was hospitalized in our unit in February of last year."[name] was brought to the Emergency Room by her family. She has been depressed, dysfunctional, and behaving in a bizarre way with confused, disorganized, psychotic thinking. The patient is not able to provide much detail. She does admit that she has many symptoms of depression with poor sleep, decreased appetite, and 10 pound weight loss, anhedonia, feeling of guilt, hopelessness, worthlessness, poor energy and concentration, crying spells, social isolation. She reports that she has been thinking of suicide for quite sometime, but is vague about trying something". She was discharged on Risperdal and prozac.  It looks like after discharge and follow-up at [facility] behavior only briefly but has been without medications for about a year.   Past Medical History: History reviewed. No pertinent past medical history. History reviewed. No pertinent surgical history.   Family History: History reviewed. No pertinent family history.   Family Psychiatric  History: Patient reports having an aunt with schizophrenia. Her father was an alcoholic. There is no suicides in her family. There is an aunt who committed homicide  Social History: Patient is single, never married, doesn't have any children, she graduated from high school. 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 She lives by herself in an apartment in [place]. She recently moved back with her parents. She denies any legal history History  Alcohol Use  . Yes     History  Drug Use  . Types: Marijuana    Social History   Social History  . Marital status: Single    Spouse name: N/A  . Number of children: N/A  . Years of education: N/A  Social History Main Topics  . Smoking status: Current Some Day Smoker  . Smokeless tobacco: Never Used  . Alcohol use Yes  . Drug use:     Types: Marijuana  .  Sexual activity: Not Asked   Other Topics Concern  . None   Social History Narrative  . None    Current Medications: Current Facility-Administered Medications  Medication Dose Route Frequency Provider Last Rate Last Dose  . acetaminophen (TYLENOL) tablet 650 mg  650 mg Oral Q6H PRN Audery AmelJohn T Clapacs, MD      . alum & mag hydroxide-simeth (MAALOX/MYLANTA) 200-200-20 MG/5ML suspension 30 mL  30 mL Oral Q4H PRN Audery AmelJohn T Clapacs, MD      . ARIPiprazole (ABILIFY) tablet 20 mg  20 mg Oral Daily Jimmy FootmanAndrea Hernandez-Gonzalez, MD   20 mg at 01/01/16 0913  . LORazepam (ATIVAN) tablet 2 mg  2 mg Oral Q4H PRN Audery AmelJohn T Clapacs, MD      . LORazepam (ATIVAN) tablet 2 mg  2 mg Oral QHS Jimmy FootmanAndrea Hernandez-Gonzalez, MD   2 mg at 12/31/15 2130  . magnesium hydroxide (MILK OF MAGNESIA) suspension 30 mL  30 mL Oral Daily PRN Audery AmelJohn T Clapacs, MD        Lab Results:  No results found for this or any previous visit (from the past 48 hour(s)).  Blood Alcohol level:  Lab Results  Component Value Date   ETH <5 12/26/2015    Metabolic Disorder Labs: Lab Results  Component Value Date   HGBA1C 5.1 12/28/2015   Lab Results  Component Value Date   PROLACTIN 145.4 (H) 12/28/2015   Lab Results  Component Value Date   CHOL 151 12/28/2015   TRIG 61 12/28/2015   HDL 72 12/28/2015   CHOLHDL 2.1 12/28/2015   VLDL 12 12/28/2015   LDLCALC 67 12/28/2015   LDLCALC 57 06/18/2014    Physical Findings: AIMS: Facial and Oral Movements Muscles of Facial Expression: None, normal Lips and Perioral Area: None, normal Jaw: None, normal Tongue: None, normal,Extremity Movements Upper (arms, wrists, hands, fingers): None, normal Lower (legs, knees, ankles, toes): None, normal, Trunk Movements Neck, shoulders, hips: None, normal, Overall Severity Severity of abnormal movements (highest score from questions above): None, normal Incapacitation due to abnormal movements: None, normal Patient's awareness of abnormal movements  (rate only patient's report): No Awareness, Dental Status Current problems with teeth and/or dentures?: No Does patient usually wear dentures?: No  CIWA:    COWS:     Musculoskeletal: Strength & Muscle Tone: within normal limits Gait & Station: normal Patient leans: N/A  Psychiatric Specialty Exam: Physical Exam  Constitutional: She is oriented to person, place, and time. She appears well-developed and well-nourished.  HENT:  Head: Normocephalic and atraumatic.  Eyes: EOM are normal.  Neck: Normal range of motion.  Respiratory: Effort normal.  Musculoskeletal: Normal range of motion.  Neurological: She is alert and oriented to person, place, and time.  Skin: Skin is dry.    Review of Systems  Constitutional: Negative.   HENT: Negative.   Eyes: Negative.   Respiratory: Negative.   Cardiovascular: Negative.   Gastrointestinal: Negative.   Genitourinary: Negative.   Musculoskeletal: Negative.   Skin: Negative.   Neurological: Negative.   Endo/Heme/Allergies: Negative.   Psychiatric/Behavioral: Negative.     Blood pressure 133/88, pulse 93, temperature 97.9 F (36.6 C), temperature source Oral, resp. rate 18, height 5\' 5"  (1.651 m), weight 130 lb (59 kg), last menstrual period 11/24/2015, SpO2 100 %.Body mass index  is 21.63 kg/m.  General Appearance: Fairly Groomed  Eye Contact:  Good  Speech:  Normal Rate  Volume:  Decreased  Mood:  Dysphoric  Affect:  Flat  Thought Process:  Irrelevant and Descriptions of Associations: Loose  Orientation:  Full (Time, Place, and Person)  Thought Content:  Hallucinations: None  Suicidal Thoughts:  No  Homicidal Thoughts:  No  Memory:  Immediate;   Poor Recent;   Poor Remote;   Poor  Judgement:  Impaired  Insight:  Lacking  Psychomotor Activity:  Increased  Concentration:  Concentration: Poor and Attention Span: Poor  Recall:  Poor  Fund of Knowledge:  Fair  Language:  Good  Akathisia:  No  Handed:    AIMS (if indicated):      Assets:  Manufacturing systems engineerCommunication Skills Physical Health Social Support  ADL's:  Intact  Cognition:  WNL  Sleep:  Number of Hours: 6.3     Treatment Plan Summary:  Patient is presenting with symptoms of psychosis and mania. She has been given a prior diagnosis of bipolar disorder however not a potential diagnosis could be schizoaffective disorder bipolar type  For bipolar disorder the patient has been started on abilify. Continue abilify 20 mg today. This medication will be chosen as her prolactin level is highly elevated at 145  For insomnia: I will start her on trazodone at bedtime.  For agitation: continue Ativan 0.5 mg by mouth every 4 hours as needed  Tobacco use disorder: Patient declines from receiving nicotine patch  Cannabis use disorder and past history of alcohol use: Patient continues to be in need of substance abuse treatment. Will refer to outpatient substance abuse upon discharge  Hyperprolactinemia her prolactin is 145 despite her noncompliance with antipsychotics. MRI completed today was wnl.  Tachycardia: EKG  Normal sinus rhythm with sinus arrhythmia.Normal ECG. HR 79  UTI?: 2+ wbc but no symptoms.  Culture pending  Labs hemoglobin A1c, prolactin, lipid panel have been ordered. TSH is within the normal limits. Urine pregnancy neg  Disposition was stable she will be discharged back to her parents  Follow-up to be determined.   Brandy HaleUzma Briea Mcenery, MD 01/01/2016, 11:13 AM

## 2016-01-02 LAB — URINE CULTURE

## 2016-01-02 NOTE — [facility] Group Notes (Signed)
[facility] LCSW Group Therapy  01/02/2016 2:11 PM  Type of Therapy:  Group Therapy  Participation Level:  Active  Participation Quality:  Appropriate  Affect:  Appropriate  Cognitive:  Alert  Insight:  Improving  Engagement in Therapy:  Engaged  Modes of Intervention:  Discussion, Reality Testing and Support  Summary of Progress/Problems:Coping Skills: Patients defined and discussed healthy coping skills. Patients identified healthy coping skills they would like to try during hospitalization and after discharge. CSW offered insight to varying coping skills that may have been new to patients such as practicing mindfulness. Pt stated she has developed new coping skills which include speaking with a minister, deep breathing, and talking with a psychiatrist.   Fredrich BirksAmaris G. Garnette CzechSampson MSW, LCSWA 01/02/2016, 2:27 PM

## 2016-01-02 NOTE — Plan of Care (Signed)
Problem: Coping: Goal: Ability to cope will improve Outcome: Progressing Patient using copng skills on unit this shift Tax adviser[place]ownsend RN

## 2016-01-02 NOTE — [facility] Group Notes (Signed)
[facility] Group Notes:  (Nursing/MHT/Case Management/Adjunct)  Date:  01/02/2016  Time:  4:43 AM  Type of Therapy:  Psychoeducational Skills  Participation Level:  Active  Participation Quality:  Appropriate  Affect:  Appropriate  Cognitive:  Appropriate  Insight:  Appropriate and Good  Engagement in Group:  Engaged  Modes of Intervention:  Discussion, Socialization and Support  Summary of Progress/Problems:  Jodi MilroyLaquanda Y Junious Stephens 01/02/2016, 4:43 AM

## 2016-01-02 NOTE — Plan of Care (Signed)
Problem: Coping: Goal: Ability to cope will improve Outcome: Progressing Cooperative with meds and plan of care.

## 2016-01-02 NOTE — Progress Notes (Signed)
[facility]  01/02/2016 4:03 PM Jodi Stephens  MRN:  [id number] Subjective:  The patient is a 25 year old single [place]American [place].  Patient has a history of bipolar disorder and was hospitalized in our facility a few years back.  The patient presented to our emergency department with family complaining of racing thoughts, insomnia, visual hallucinations of God, decreased need for sleep, elevated energy and feelings of going "crazy".  Patient states remains paranoid and delusionalShe reported that she was trying to wash her clothes in the washing machine. However there were rocks and stone  on her shoulders. She was talking about different things. She reported that she is trying to complete her treatment. She reported that now the washing machine smells due to clothes.. She continues to have delusional thinking. She is not improving on her medications. It was difficult to engage her in the conversation as she keeps on talking about different things.  Patient appeared to be responding to internal stimuli   Per nursing: D: Patient appears somewhat disorganized and flat. Attends group. Visible in the milieu. Denies SI/HI/AVH at this time.  A: Medication given with education. Encouragement provided.  R: Patient was compliant with medication. She remains calm and cooperative. Safety maintained with 15 min checks.   Principal Problem: Bipolar affective disorder, mixed, severe, with psychotic behavior (HCC) Diagnosis:   Patient Active Problem List   Diagnosis Date Noted  . Cannabis use disorder, severe, dependence (HCC) [F12.20] 12/29/2015  . Hyperprolactinemia (HCC) [E22.1] 12/29/2015  . Tobacco use disorder [F17.200] 12/28/2015  . Bipolar affective disorder, mixed, severe, with psychotic behavior (HCC) [F31.64] 12/26/2015   Total Time spent with patient: 30 minutes  Past Psychiatric History: Patient was hospitalized in our unit in February  of last year."Jodi Stephens was brought to the Emergency Room by her family. She has been depressed, dysfunctional, and behaving in a bizarre way with confused, disorganized, psychotic thinking. The patient is not able to provide much detail. She does admit that she has many symptoms of depression with poor sleep, decreased appetite, and 10 pound weight loss, anhedonia, feeling of guilt, hopelessness, worthlessness, poor energy and concentration, crying spells, social isolation. She reports that she has been thinking of suicide for quite sometime, but is vague about trying something". She was discharged on Risperdal and prozac.  It looks like after discharge and follow-up at Jodi Stephens behavior only briefly but has been without medications for about a year.   Past Medical History: History reviewed. No pertinent past medical history. History reviewed. No pertinent surgical history.   Family History: History reviewed. No pertinent family history.   Family Psychiatric  History: Patient reports having an aunt with schizophrenia. Her father was an alcoholic. There is no suicides in her family. There is an aunt who committed homicide  Social History: Patient is single, never married, doesn't have any children, she graduated from high school. 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 She lives by herself in an apartment in [place]. She recently moved back with her parents. She denies any legal history History  Alcohol Use  . Yes     History  Drug Use  . Types: Marijuana    Social History   Social History  . Marital status: Single    Spouse name: N/A  . Number of children: N/A  . Years of education: N/A   Social History Main Topics  . Smoking status:  Current Some Day Smoker  . Smokeless tobacco: Never Used  . Alcohol use Yes  . Drug use:     Types: Marijuana  . Sexual activity: Not Asked   Other Topics Concern  . None   Social History Narrative   . None    Current Medications: Current Facility-Administered Medications  Medication Dose Route Frequency Provider Last Rate Last Dose  . acetaminophen (TYLENOL) tablet 650 mg  650 mg Oral Q6H PRN Audery AmelJohn T Clapacs, MD      . alum & mag hydroxide-simeth (MAALOX/MYLANTA) 200-200-20 MG/5ML suspension 30 mL  30 mL Oral Q4H PRN Audery AmelJohn T Clapacs, MD      . ARIPiprazole (ABILIFY) tablet 20 mg  20 mg Oral Daily Jimmy FootmanAndrea Hernandez-Gonzalez, MD   20 mg at 01/02/16 0854  . LORazepam (ATIVAN) tablet 0.5 mg  0.5 mg Oral Q6H PRN Brandy HaleUzma Jaylei Fuerte, MD      . magnesium hydroxide (MILK OF MAGNESIA) suspension 30 mL  30 mL Oral Daily PRN Audery AmelJohn T Clapacs, MD      . traZODone (DESYREL) tablet 50 mg  50 mg Oral QHS Brandy HaleUzma Theodoros Stjames, MD   50 mg at 01/01/16 2127    Lab Results:  No results found for this or any previous visit (from the past 48 hour(s)).  Blood Alcohol level:  Lab Results  Component Value Date   ETH <5 12/26/2015    Metabolic Disorder Labs: Lab Results  Component Value Date   HGBA1C 5.1 12/28/2015   Lab Results  Component Value Date   PROLACTIN 145.4 (H) 12/28/2015   Lab Results  Component Value Date   CHOL 151 12/28/2015   TRIG 61 12/28/2015   HDL 72 12/28/2015   CHOLHDL 2.1 12/28/2015   VLDL 12 12/28/2015   LDLCALC 67 12/28/2015   LDLCALC 57 06/18/2014    Physical Findings: AIMS: Facial and Oral Movements Muscles of Facial Expression: None, normal Lips and Perioral Area: None, normal Jaw: None, normal Tongue: None, normal,Extremity Movements Upper (arms, wrists, hands, fingers): None, normal Lower (legs, knees, ankles, toes): None, normal, Trunk Movements Neck, shoulders, hips: None, normal, Overall Severity Severity of abnormal movements (highest score from questions above): None, normal Incapacitation due to abnormal movements: None, normal Patient's awareness of abnormal movements (rate only patient's report): No Awareness, Dental Status Current problems with teeth and/or  dentures?: No Does patient usually wear dentures?: No  CIWA:    COWS:     Musculoskeletal: Strength & Muscle Tone: within normal limits Gait & Station: normal Patient leans: N/A  Psychiatric Specialty Exam: Physical Exam  Constitutional: She is oriented to person, place, and time. She appears well-developed and well-nourished.  HENT:  Head: Normocephalic and atraumatic.  Eyes: EOM are normal.  Neck: Normal range of motion.  Respiratory: Effort normal.  Musculoskeletal: Normal range of motion.  Neurological: She is alert and oriented to person, place, and time.  Skin: Skin is dry.    Review of Systems  Constitutional: Negative.   HENT: Negative.   Eyes: Negative.   Respiratory: Negative.   Cardiovascular: Negative.   Gastrointestinal: Negative.   Genitourinary: Negative.   Musculoskeletal: Negative.   Skin: Negative.   Neurological: Negative.   Endo/Heme/Allergies: Negative.   Psychiatric/Behavioral: Negative.     Blood pressure 139/86, pulse 89, temperature 98.5 F (36.9 C), temperature source Oral, resp. rate 18, height 5\' 5"  (1.651 m), weight 130 lb (59 kg), last menstrual period 11/24/2015, SpO2 100 %.Body mass index is 21.63 kg/m.  General Appearance: Fairly Groomed  Eye Contact:  Good  Speech:  Normal Rate  Volume:  Decreased  Mood:  Dysphoric  Affect:  Flat  Thought Process:  Irrelevant and Descriptions of Associations: Loose  Orientation:  Full (Time, Place, and Person)  Thought Content:  Hallucinations: None  Suicidal Thoughts:  No  Homicidal Thoughts:  No  Memory:  Immediate;   Poor Recent;   Poor Remote;   Poor  Judgement:  Impaired  Insight:  Lacking  Psychomotor Activity:  Increased  Concentration:  Concentration: Poor and Attention Span: Poor  Recall:  Poor  Fund of Knowledge:  Fair  Language:  Good  Akathisia:  No  Handed:    AIMS (if indicated):     Assets:  Manufacturing systems engineer Physical Health Social Support  ADL's:  Intact   Cognition:  WNL  Sleep:  Number of Hours: 7.15     Treatment Plan Summary:  Patient is presenting with symptoms of psychosis and mania. She has been given a prior diagnosis of bipolar disorder however not a potential diagnosis could be schizoaffective disorder bipolar type  For bipolar disorder the patient has been started on abilify. Continue abilify 20 mg today. This medication will be chosen as her prolactin level is highly elevated at 145  For insomnia: I will start her on trazodone at bedtime.  For agitation: continue Ativan 0.5 mg by mouth every 4 hours as needed  Tobacco use disorder: Patient declines from receiving nicotine patch  Cannabis use disorder and past history of alcohol use: Patient continues to be in need of substance abuse treatment. Will refer to outpatient substance abuse upon discharge  Hyperprolactinemia her prolactin is 145 despite her noncompliance with antipsychotics. MRI completed today was wnl.  Tachycardia: EKG  Normal sinus rhythm with sinus arrhythmia.Normal ECG. HR 79  UTI?: 2+ wbc but no symptoms.  Culture pending  Labs hemoglobin A1c, prolactin, lipid panel have been ordered. TSH is within the normal limits. Urine pregnancy neg  Disposition was stable she will be discharged back to her parents  Follow-up to be determined.   Brandy Hale, MD 01/02/2016, 4:03 PM

## 2016-01-02 NOTE — Progress Notes (Signed)
D: Pt alert and oriented x 4. Patient denies pain/SI/HI/AVH.  Patient states she needs to start writing questions she has for the doctor so she don't forget them. Patient states she has a pen and paper.  A: Staff to monitor Q 15 mins for safety. Encouragement and support offered. Scheduled medications administered per orders. R: Patient remains safe on the unit. Patient attended group tonight. Patient visible on hte unit and interacting with peers. Patient taking administered medications.

## 2016-01-02 NOTE — Progress Notes (Addendum)
Patient with flat affect, cooperative behavior with meals, meds and plan of care. No SI/HI at this time. Quiet speech, brief eye contact. Speech is quiet. Minimal interaction with peers. Therapy groups encouraged. Patient vague and states she is feeling better, unable to state in which way. Safety maintained. Completes daily audit sheet with going home as her goal. Meets with MD.

## 2016-01-02 NOTE — Progress Notes (Signed)
D: Patient is alert and oriented on the unit this shift. Patient attended and actively participated in groups today. Patient denies suicidal ideation, homicidal ideation, auditory or visual hallucinations at the present time.  A: Scheduled medications are administered to patient as per MD orders. Emotional support and encouragement are provided. Patient is maintained on q.15 minute safety checks. Patient is informed to notify staff with questions or concerns. R: No adverse medication reactions are noted. Patient is cooperative with medication administration and treatment plan today. Patient is receptive, anxius and cooperative on the unit at this time. Patient does not interact with others on the unit this shift. Patient contracts for safety at this time. Patient remains safe at this time.

## 2016-01-03 NOTE — [facility] Group Notes (Signed)
[facility] Group Notes:  (Nursing/MHT/Case Management/Adjunct)  Date:  01/03/2016  Time:  6:00 PM  Type of Therapy:  Psychoeducational Skills  Participation Level:  Active  Participation Quality:  Appropriate, Sharing and Supportive  Affect:  Appropriate  Cognitive:  Appropriate  Insight:  Appropriate  Engagement in Group:  Developing/Improving  Modes of Intervention:  Discussion  Summary of Progress/Problems:  Osa CraverBrenda C Aldyn Toon 01/03/2016, 6:00 PM

## 2016-01-03 NOTE — Plan of Care (Signed)
Problem: Coping: Goal: Ability to cope will improve Outcome: Not Progressing Patient still have some delusional thoughts.

## 2016-01-03 NOTE — Progress Notes (Signed)
Patient was crying this morning since she can not watch solar eclipse with her parents.Patient states "I am having a heart breaking time now.'When asked for the reason patient states "The person I am in love rejects me."Stated that her parents are her every thing.Continues to have some delusional and paranoid thoughts.Denies suicidal or homicidal ideations & AV hallucinations.Attended groups and compliant with medications.

## 2016-01-03 NOTE — [facility] Group Notes (Signed)
[facility] Group Notes:  (Nursing/MHT/Case Management/Adjunct)  Date:  01/03/2016  Time:  9:54 PM  Type of Therapy:  Evening Wrap-up Group  Participation Level:  Did Not Attend  Participation Quality:  N/A  Affect:  N/A  Cognitive:  N/A  Insight:  None  Engagement in Group:  Did Not Attend  Modes of Intervention:  Activity and Discussion  Summary of Progress/Problems:  Tomasita MorrowChelsea Nanta Decorey Wahlert 01/03/2016, 9:54 PM

## 2016-01-03 NOTE — [facility] Group Notes (Signed)
[facility] Group Notes:  (Nursing/MHT/Case Management/Adjunct)  Date:  01/03/2016  Time:  1:32 AM  Type of Therapy:  Group Therapy  Participation Level:  Active  Participation Quality:  Appropriate  Affect:  Appropriate  Cognitive:  Appropriate  Insight:  Appropriate  Engagement in Group:  Engaged  Modes of Intervention:  n/a  Summary of Progress/Problems:  Veva Holesshley Imani Drezden Seitzinger 01/03/2016, 1:32 AM

## 2016-01-03 NOTE — Progress Notes (Signed)
[facility]  01/03/2016 4:08 PM Jodi Stephens  MRN:  [id number] Subjective:  Jodi Stephens remains paranoid and delusional. Her thinking is disorganized. She called her father and asked to pick her up today. She believes that she has some documents to sign and needs to get in touch with her sister immediately. She believes that she is here voluntarily and wants to sign herself out. She is out in the community and interacts with peers and staff. There are no behavioral problems. She accepts medications and seems to tolerate them well. There are no somatic complaints.  Principal Problem: Bipolar affective disorder, mixed, severe, with psychotic behavior (HCC) Diagnosis:   Patient Active Problem List   Diagnosis Date Noted  . Cannabis use disorder, severe, dependence (HCC) [F12.20] 12/29/2015  . Hyperprolactinemia (HCC) [E22.1] 12/29/2015  . Tobacco use disorder [F17.200] 12/28/2015  . Bipolar affective disorder, mixed, severe, with psychotic behavior (HCC) [F31.64] 12/26/2015   Total Time spent with patient: 30 minutes  Past Psychiatric History: Patient was hospitalized in our unit in February of last year."Jodi Stephens was brought to the Emergency Room by her family. She has been depressed, dysfunctional, and behaving in a bizarre way with confused, disorganized, psychotic thinking. The patient is not able to provide much detail. She does admit that she has many symptoms of depression with poor sleep, decreased appetite, and 10 pound weight loss, anhedonia, feeling of guilt, hopelessness, worthlessness, poor energy and concentration, crying spells, social isolation. She reports that she has been thinking of suicide for quite sometime, but is vague about trying something". She was discharged on Risperdal and prozac.  It looks like after discharge and [facility] only briefly but has been without medications for about a year.   Past Medical History: History reviewed. No  pertinent past medical history. History reviewed. No pertinent surgical history.   Family History: History reviewed. No pertinent family history.   Family Psychiatric  History: Patient reports having an aunt with schizophrenia. Her father was an alcoholic. There is no suicides in her family. There is an aunt who committed homicide  Social History: Patient is single, never married, doesn't have any children, she graduated from high school. 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 She lives by herself in an apartment in [place]. She recently moved back with her parents. She denies any legal history History  Alcohol Use  . Yes     History  Drug Use  . Types: Marijuana    Social History   Social History  . Marital status: Single    Spouse name: N/A  . Number of children: N/A  . Years of education: N/A   Social History Main Topics  . Smoking status: Current Some Day Smoker  . Smokeless tobacco: Never Used  . Alcohol use Yes  . Drug use:     Types: Marijuana  . Sexual activity: Not Asked   Other Topics Concern  . None   Social History Narrative  . None    Current Medications: Current Facility-Administered Medications  Medication Dose Route Frequency Provider Last Rate Last Dose  . acetaminophen (TYLENOL) tablet 650 mg  650 mg Oral Q6H PRN Audery AmelJohn T Clapacs, MD      . alum & mag hydroxide-simeth (MAALOX/MYLANTA) 200-200-20 MG/5ML suspension 30 mL  30 mL Oral Q4H PRN Audery AmelJohn T Clapacs, MD      . ARIPiprazole (ABILIFY) tablet 20 mg  20 mg  Oral Daily Jimmy FootmanAndrea Hernandez-Gonzalez, MD   20 mg at 01/03/16 0931  . LORazepam (ATIVAN) tablet 0.5 mg  0.5 mg Oral Q6H PRN Brandy HaleUzma Faheem, MD      . magnesium hydroxide (MILK OF MAGNESIA) suspension 30 mL  30 mL Oral Daily PRN Audery AmelJohn T Clapacs, MD      . traZODone (DESYREL) tablet 50 mg  50 mg Oral QHS Brandy HaleUzma Faheem, MD   50 mg at 01/02/16 2214    Lab Results:  No results found for this or any previous visit  (from the past 48 hour(s)).  Blood Alcohol level:  Lab Results  Component Value Date   ETH <5 12/26/2015    Metabolic Disorder Labs: Lab Results  Component Value Date   HGBA1C 5.1 12/28/2015   Lab Results  Component Value Date   PROLACTIN 145.4 (H) 12/28/2015   Lab Results  Component Value Date   CHOL 151 12/28/2015   TRIG 61 12/28/2015   HDL 72 12/28/2015   CHOLHDL 2.1 12/28/2015   VLDL 12 12/28/2015   LDLCALC 67 12/28/2015   LDLCALC 57 06/18/2014    Physical Findings: AIMS: Facial and Oral Movements Muscles of Facial Expression: None, normal Lips and Perioral Area: None, normal Jaw: None, normal Tongue: None, normal,Extremity Movements Upper (arms, wrists, hands, fingers): None, normal Lower (legs, knees, ankles, toes): None, normal, Trunk Movements Neck, shoulders, hips: None, normal, Overall Severity Severity of abnormal movements (highest score from questions above): None, normal Incapacitation due to abnormal movements: None, normal Patient's awareness of abnormal movements (rate only patient's report): No Awareness, Dental Status Current problems with teeth and/or dentures?: No Does patient usually wear dentures?: No  CIWA:    COWS:     Musculoskeletal: Strength & Muscle Tone: within normal limits Gait & Station: normal Patient leans: N/A  Psychiatric Specialty Exam: Physical Exam  Nursing note and vitals reviewed.   Review of Systems  Constitutional: Negative.   HENT: Negative.   Eyes: Negative.   Respiratory: Negative.   Cardiovascular: Negative.   Gastrointestinal: Negative.   Genitourinary: Negative.   Musculoskeletal: Negative.   Skin: Negative.   Neurological: Negative.   Endo/Heme/Allergies: Negative.   Psychiatric/Behavioral: Negative.     Blood pressure (!) 144/91, pulse 95, temperature 98.8 F (37.1 C), temperature source Oral, resp. rate 18, height 5\' 5"  (1.651 m), weight 59 kg (130 lb), last menstrual period 11/24/2015, SpO2 100  %.Body mass index is 21.63 kg/m.  General Appearance: Fairly Groomed  Eye Contact:  Good  Speech:  Normal Rate  Volume:  Decreased  Mood:  Dysphoric  Affect:  Flat  Thought Process:  Irrelevant and Descriptions of Associations: Loose  Orientation:  Full (Time, Place, and Person)  Thought Content:  Hallucinations: None  Suicidal Thoughts:  No  Homicidal Thoughts:  No  Memory:  Immediate;   Poor Recent;   Poor Remote;   Poor  Judgement:  Impaired  Insight:  Lacking  Psychomotor Activity:  Increased  Concentration:  Concentration: Poor and Attention Span: Poor  Recall:  Poor  Fund of Knowledge:  Fair  Language:  Good  Akathisia:  No  Handed:    AIMS (if indicated):     Assets:  Manufacturing systems engineerCommunication Skills Physical Health Social Support  ADL's:  Intact  Cognition:  WNL  Sleep:  Number of Hours: 6.25     Treatment Plan Summary:  Patient is presenting with symptoms of psychosis and mania. She has been given a prior diagnosis of bipolar disorder however not a potential  diagnosis could be schizoaffective disorder bipolar type  For bipolar disorder the patient has been started on abilify. Continue abilify 20 mg today. This medication will be chosen as her prolactin level is highly elevated at 145  For insomnia: I will start her on trazodone at bedtime.  For agitation: continue Ativan 0.5 mg by mouth every 4 hours as needed  Tobacco use disorder: Patient declines from receiving nicotine patch  Cannabis use disorder and past history of alcohol use: Patient continues to be in need of substance abuse treatment. Will refer to outpatient substance abuse upon discharge  Hyperprolactinemia her prolactin is 145 despite her noncompliance with antipsychotics. MRI completed today was wnl.  Tachycardia: EKG  Normal sinus rhythm with sinus arrhythmia.Normal ECG. HR 79  UTI?: 2+ wbc but no symptoms.  Culture pending  Labs hemoglobin A1c, prolactin, lipid panel have been ordered. TSH is  within the normal limits. Urine pregnancy neg  Disposition was stable she will be discharged back to her parents  Follow-up to be determined.   Kristine Linea, MD 01/03/2016, 4:08 PM

## 2016-01-03 NOTE — Progress Notes (Signed)
Patient got up and was pacing hallways. Patient states, "I am working on myself by walking on my toes."  This writer advised patient that right now is really time to try and rest and get some sleep. Patient agreed and went to room.

## 2016-01-03 NOTE — Progress Notes (Signed)
Recreation Therapy Notes  Date: 08.21.17 Time: 9:30 am Location: Craft Room  Group Topic: Self-expression  Goal Area(s) Addresses:  Patient will identify one color per emotion listed on wheel. Patient will verbalize benefit of using art as a means of self-expression. Patient will verbalize one emotion experienced during session. Patient will be educated on other forms of self-expression.  Behavioral Response: Attentive, Interactive  Intervention: Emotion Wheel  Activity: Patients were given an Arboriculturistmotion Wheel worksheet and instructed to pick a color for each emotion listed on the wheel.  Education: LRT educated patients on other forms of self-expression.  Education Outcome: Acknowledges education/In group clarification offered  Clinical Observations/Feedback: Patient completed activity by picking a color for each emotion. Patient contributed to group discussion by stating what colors she picked for certain emotions, how it felt to see her emotions in color, and what emotions she felt while she was coloring.  Jacquelynn CreeGreene,Khrista Braun M, LRT/CTRS 01/03/2016 10:25 AM

## 2016-01-04 MED ORDER — ARIPIPRAZOLE 10 MG PO TABS: 30.0000 mg | ORAL_TABLET | Freq: Every day | ORAL | Status: DC

## 2016-01-04 MED ORDER — LITHIUM CARBONATE ER 450 MG PO TBCR
450.0000 mg | EXTENDED_RELEASE_TABLET | Freq: Two times a day (BID) | ORAL | Status: DC
  Administered 2016-01-04 – 2016-01-10 (×13): 450 mg via ORAL
  Filled 2016-01-04 (×15): qty 1

## 2016-01-04 MED ORDER — LORAZEPAM 0.5 MG PO TABS
1.0000 mg | ORAL_TABLET | Freq: Four times a day (QID) | ORAL | Status: DC | PRN
  Administered 2016-01-05 – 2016-01-09 (×2): 1 mg via ORAL
  Filled 2016-01-04 (×2): qty 2

## 2016-01-04 MED ORDER — AMOXICILLIN 500 MG PO CAPS
500.0000 mg | ORAL_CAPSULE | Freq: Three times a day (TID) | ORAL | Status: DC
  Administered 2016-01-04 – 2016-01-10 (×19): 500 mg via ORAL
  Filled 2016-01-04 (×20): qty 1

## 2016-01-04 NOTE — Progress Notes (Signed)
Recreation Therapy Notes  Date: 08.22.17 Time: 9:30 am Location: Craft Room  Group Topic: Goal Setting  Goal Area(s) Addresses:  Patient will write one goal. Patient will write at least one supportive statement.  Behavioral Response: Attentive, Interactive, Left early  Intervention: Step By Step  Activity: Patients were given a foot worksheet and instructed to write a goal inside the foot. On the outside of the foot, patients were instructed to write supportive statements to help them reach their goals.  Education: LRT educated patients on healthy ways to celebrate reaching their goals.  Education Outcome: Patient left before LRT educated group.  Clinical Observations/Feedback: Patient completed activity by writing a goal and supportive statements. Patient contributed to group discussion by stating it was easy and difficult to think of her goal and supportive statements and why, and why it is important to write supportive statements.  Jacquelynn CreeGreene,Raeshaun Simson M, LRT/CTRS 01/04/2016 10:20 AM

## 2016-01-04 NOTE — Progress Notes (Signed)
Ms Jodi Stephens is getting better in mood and affect, able to tell me that her medications were changed today and the reasons behind the adjustment. She is less insecured, mood is better than yesterday, upbeat, denied SI/HI, interacting well with peers, appearance well groom, attended the evening group. Nursing staffs will continue to provide medication education, encourage expression of feelings, provide clinical and moral support.

## 2016-01-04 NOTE — Progress Notes (Signed)
Visible in the milieu, interacting fairly well with peers, A&Ox3, denied pain, poor insight and judgment to her disease process, medication compliant, insisted on getting "Abilify is what I need and that's what I am taking .." Receptive of explanation provided that Abilify is schedule daily and Trazodone 50 mg at bedtime. Flat affect, mood pleasant but sad. Handed a piece of paper, "Here call my parents: [phone] - Dad, [phone] - Mom, tell them to pick me up tomorrow .Marland Kitchen.Marland Kitchen."

## 2016-01-04 NOTE — Progress Notes (Signed)
[facility]  01/04/2016 9:03 AM Jodi Stephens  MRN:  [id number] Subjective:  Jodi Stephens remains paranoid and delusional. Her thinking is disorganized. She called her father yesterday and asked him to pick her up.  Today during assessment her mood was labile (crying for unclear reasons).  She stated that everybody in the unit was looking at her and she kept looking out the window.  Pt reports anxiety and insomnia.  Per nursing: Patient was crying this morning since she can not watch solar eclipse with her parents.Patient states "I am having a heart breaking time now.'When asked for the reason patient states "The person I am in love rejects me."Stated that her parents are her every thing.Continues to have some delusional and paranoid thoughts.Denies suicidal or homicidal ideations & AV hallucinations.Attended groups and compliant with medications.  Principal Problem: Bipolar affective disorder, mixed, severe, with psychotic behavior (HCC) Diagnosis:   Patient Active Problem List   Diagnosis Date Noted  . Cannabis use disorder, severe, dependence (HCC) [F12.20] 12/29/2015  . Hyperprolactinemia (HCC) [E22.1] 12/29/2015  . Tobacco use disorder [F17.200] 12/28/2015  . Bipolar affective disorder, mixed, severe, with psychotic behavior (HCC) [F31.64] 12/26/2015   Total Time spent with patient: 30 minutes  Past Psychiatric History: Patient was hospitalized in our unit in February of last year."Jodi Stephens was brought to the Emergency Room by her family. She has been depressed, dysfunctional, and behaving in a bizarre way with confused, disorganized, psychotic thinking. The patient is not able to provide much detail. She does admit that she has many symptoms of depression with poor sleep, decreased appetite, and 10 pound weight loss, anhedonia, feeling of guilt, hopelessness, worthlessness, poor energy and concentration, crying spells, social isolation. She reports that she has been thinking of  suicide for quite sometime, but is vague about trying something". She was discharged on Risperdal and prozac.  It looks like after discharge and [facility] only [facility] but has been without medications for about a year.   Past Medical History: History reviewed. No pertinent past medical history. History reviewed. No pertinent surgical history.   Family History: History reviewed. No pertinent family history.   Family Psychiatric  History: Patient reports having an aunt with schizophrenia. Her father was an alcoholic. There is no suicides in her family. There is an aunt who committed homicide  Social History: Patient is single, never married, doesn't have any children, she graduated from high school. 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 [place] by [place]. She recently moved back with her parents. She denies any legal history History  Alcohol Use  . Yes     History  Drug Use  . Types: Marijuana    Social History   Social History  . Marital status: Single    Spouse name: N/A  . Number of children: N/A  . Years of education: N/A   Social History Main Topics  . Smoking status: Current Some Day Smoker  . Smokeless tobacco: Never Used  . Alcohol use Yes  . Drug use:     Types: Marijuana  . Sexual activity: Not Asked   Other Topics Concern  . None   Social History Narrative  . None    Current Medications: Current Facility-Administered Medications  Medication Dose Route Frequency Provider Last Rate Last Dose  . acetaminophen (TYLENOL) tablet 650 mg  650 mg Oral Q6H PRN Jackquline DenmarkJohn T  Clapacs, MD      . alum & mag hydroxide-simeth (MAALOX/MYLANTA) 200-200-20 MG/5ML suspension 30 mL  30 mL Oral Q4H PRN Audery AmelJohn T Clapacs, MD      . Melene Muller[START ON 01/05/2016] ARIPiprazole (ABILIFY) tablet 30 mg  30 mg Oral Daily Jimmy FootmanAndrea Hernandez-Gonzalez, MD      . LORazepam (ATIVAN) tablet 1 mg  1 mg Oral Q6H PRN  Jimmy FootmanAndrea Hernandez-Gonzalez, MD      . magnesium hydroxide (MILK OF MAGNESIA) suspension 30 mL  30 mL Oral Daily PRN Audery AmelJohn T Clapacs, MD        Lab Results:  No results found for this or any previous visit (from the past 48 hour(s)).  Blood Alcohol level:  Lab Results  Component Value Date   ETH <5 12/26/2015    Metabolic Disorder Labs: Lab Results  Component Value Date   HGBA1C 5.1 12/28/2015   Lab Results  Component Value Date   PROLACTIN 145.4 (H) 12/28/2015   Lab Results  Component Value Date   CHOL 151 12/28/2015   TRIG 61 12/28/2015   HDL 72 12/28/2015   CHOLHDL 2.1 12/28/2015   VLDL 12 12/28/2015   LDLCALC 67 12/28/2015   LDLCALC 57 06/18/2014    Physical Findings: AIMS: Facial and Oral Movements Muscles of Facial Expression: None, normal Lips and Perioral Area: None, normal Jaw: None, normal Tongue: None, normal,Extremity Movements Upper (arms, wrists, hands, fingers): None, normal Lower (legs, knees, ankles, toes): None, normal, Trunk Movements Neck, shoulders, hips: None, normal, Overall Severity Severity of abnormal movements (highest score from questions above): None, normal Incapacitation due to abnormal movements: None, normal Patient's awareness of abnormal movements (rate only patient's report): No Awareness, Dental Status Current problems with teeth and/or dentures?: No Does patient usually wear dentures?: No  CIWA:    COWS:     Musculoskeletal: Strength & Muscle Tone: within normal limits Gait & Station: normal Patient leans: N/A  Psychiatric Specialty Exam: Physical Exam  Nursing note and vitals reviewed. Constitutional: She is oriented to person, place, and time. She appears well-developed and well-nourished.  HENT:  Head: Normocephalic and atraumatic.  Eyes: EOM are normal.  Neck: Normal range of motion.  Respiratory: Effort normal.  Musculoskeletal: Normal range of motion.  Neurological: She is oriented to person, place, and time.     Review of Systems  Constitutional: Negative.   HENT: Negative.   Eyes: Negative.   Respiratory: Negative.   Cardiovascular: Negative.   Gastrointestinal: Negative.   Genitourinary: Negative.   Musculoskeletal: Negative.   Skin: Negative.   Neurological: Negative.   Endo/Heme/Allergies: Negative.   Psychiatric/Behavioral: Positive for substance abuse. Negative for suicidal ideas. The patient is nervous/anxious and has insomnia.     Blood pressure (!) 153/99, pulse (!) 106, temperature 98.2 F (36.8 C), temperature source Oral, resp. rate 20, height 5\' 5"  (1.651 m), weight 59 kg (130 lb), last menstrual period 11/24/2015, SpO2 100 %.Body mass index is 21.63 kg/m.  General Appearance: Fairly Groomed  Eye Contact:  Good  Speech:  Normal Rate  Volume:  Decreased  Mood:  Dysphoric  Affect:  Labile  Thought Process:  Irrelevant and Descriptions of Associations: Loose  Orientation:  Full (Time, Place, and Person)  Thought Content:  Hallucinations: None  Suicidal Thoughts:  No  Homicidal Thoughts:  No  Memory:  Immediate;   Poor Recent;   Poor Remote;   Poor  Judgement:  Impaired  Insight:  Lacking  Psychomotor Activity:  Increased  Concentration:  Concentration: Poor  and Attention Span: Poor  Recall:  Poor  Fund of Knowledge:  Fair  Language:  Good  Akathisia:  No  Handed:    AIMS (if indicated):     Assets:  Manufacturing systems engineer Physical Health Social Support  ADL's:  Intact  Cognition:  WNL  Sleep:  Number of Hours: 4.45     Treatment Plan Summary:  Patient is presenting with symptoms of psychosis and mania. She has been given a prior diagnosis of bipolar disorder however not a potential diagnosis could be schizoaffective disorder bipolar type  For bipolar disorder the patient has been started on abilify. Continue abilify but will increase to 30mg  today. This medication will be chosen as her prolactin level is highly elevated at 145.  Will also add lithium CR 450 mg  po bid for mood symptoms  For insomnia: only slept 4 h last night. Will restart ativan 1 mg qhs  For agitation: continue Ativan 0.5 mg by mouth every 4 hours as needed   Tobacco use disorder: Patient declines from receiving nicotine patch  Cannabis use disorder and past history of alcohol use: Patient continues to be in need of substance abuse treatment. Will refer to outpatient substance abuse upon discharge  Hyperprolactinemia her prolactin is 145 despite her noncompliance with antipsychotics. MRI completed today was wnl.  Tachycardia: EKG  Normal sinus rhythm with sinus arrhythmia.Normal ECG. HR 79  UTI: will start amoxacillin 500 mg tid for 7 days  Labs hemoglobin A1c, prolactin, lipid panel have been ordered. TSH is within the normal limits. Urine pregnancy neg  Disposition was stable she will be discharged back to her parents  Follow-up to be determined.  Spoke with Father on Friday and Today.   Jimmy Footman, MD 01/04/2016, 9:03 AM

## 2016-01-04 NOTE — Plan of Care (Signed)
Problem: Safety: Goal: Ability to remain free from injury will improve Outcome: Progressing Medications administered as ordered by the physician, medications Therapeutic Effects, SEs and Adverse effects discussed, questions encouraged; no PRN given, 15 minute checks maintained for safety, clinical and moral support provided, patient encouraged to continue to express feelings and demonstrate safe care. Patient remain free from harm, will continue to monitor.         

## 2016-01-04 NOTE — Plan of Care (Signed)
Problem: Coping: Goal: Ability to cope will improve Outcome: Progressing Using coping  Skills when  Patient not able to get her way.

## 2016-01-04 NOTE — [facility] Group Notes (Addendum)
[facility] LCSW Group Therapy   01/04/2016 2pm  Type of Therapy: Group Therapy   Participation Level: Active   Participation Quality: Attentive, Sharing and Supportive   Affect: Appropriate  Cognitive: Alert and Oriented   Insight: Developing/Improving and Engaged   Engagement in Therapy: Developing/Improving and Engaged   Modes of Intervention: Clarification, Confrontation, Discussion, Education, Exploration,  Limit-setting, Orientation, Problem-solving, Rapport Building, Dance movement psychotherapisteality Testing, Socialization and Support  Summary of Progress/Problems: The topic for group therapy was feelings about diagnosis. Pt actively participated in group discussion on their past and current diagnosis and how they feel towards this. Pt also identified how society and family members judge them, based on their diagnosis as well as stereotypes and stigmas. Pt shared with the group on topics discussed initially but soon began to share on subjects not related to the topic and stated disconnected thoughts and feelings unrelated to the topic.  Pt presented as pleasant and calm but then reported to the CSW that the pt believed the CSW was "making fun" of the pt.  CSW assured the pt this was not so and apologized for any misunderstanding and the pt soon presented as relaxed then soon after began to make snide comments to a fellow group member about the way that group member acted "aggressive".  Pt presented as inappropriate when making these comments, but otherwise was polite and cooperative with the CSW and other group members and focused and attentive to the topics discussed and the sharing of others.     Dorothe PeaJonathan F. Subhan Hoopes, LCSWA, LCAS

## 2016-01-04 NOTE — [facility] Group Notes (Addendum)
Goals Group Date/Time: 01/04/2016 9:00 AM Type of Therapy and Topic: Group Therapy: Goals Group: SMART Goals   Participation Level: Moderate  Description of Group:    The purpose of a daily goals group is to assist and guide patients in setting recovery/wellness-related goals. The objective is to set goals as they relate to the crisis in which they were admitted. Patients will be using SMART goal modalities to set measurable goals. Characteristics of realistic goals will be discussed and patients will be assisted in setting and processing how one will reach their goal. Facilitator will also assist patients in applying interventions and coping skills learned in psycho-education groups to the SMART goal and process how one will achieve defined goal.   Therapeutic Goals:   -Patients will develop and document one goal related to or their crisis in which brought them into treatment.  -Patients will be guided by LCSW using SMART goal setting modality in how to set a measurable, attainable, realistic and time sensitive goal.  -Patients will process barriers in reaching goal.  -Patients will process interventions in how to overcome and successful in reaching goal.   Patient's Goal: Patient invited but did not attend.   Therapeutic Modalities:  Motivational Interviewing  Cognitive Behavioral Therapy  Crisis Intervention Model  SMART goals setting   Emelynn Rance R. Sheridan Gettel, LCSWA    

## 2016-01-04 NOTE — Progress Notes (Signed)
Patient remains disorganized and confused . Patient answers questions that other people are in conversation. Voice of people  On unit being attracted to her . limited   Patient stated slept good last night Stated appetite is good and energy level  Is normal. Stated concentration is good . Stated on Depression scale 4, hopeless 10 and anxiety 4 .( low 0-10 high) Denies suicidal  homicidal ideations  .  No auditory hallucinations  No pain concerns . Appropriate ADL'S. Interacting with peers and staff.  A: Encourage patient participation with unit programming . Instruction  Given on  Medication , verbalize understanding. R: Voice no other concerns. Staff continue to monitor

## 2016-01-05 MED ORDER — OLANZAPINE 10 MG PO TABS
10.0000 mg | ORAL_TABLET | Freq: Two times a day (BID) | ORAL | Status: DC
  Administered 2016-01-05 – 2016-01-09 (×9): 10 mg via ORAL
  Filled 2016-01-05 (×9): qty 1

## 2016-01-05 MED ORDER — LORAZEPAM 2 MG PO TABS
2.0000 mg | ORAL_TABLET | Freq: Every day | ORAL | Status: DC
  Administered 2016-01-05 – 2016-01-09 (×5): 2 mg via ORAL
  Filled 2016-01-05 (×5): qty 1

## 2016-01-05 NOTE — Progress Notes (Signed)
[facility] MD Progress Note  01/05/2016 2:58 PM Jodi PeachMercy Larae Stephens  MRN:  [id number] Subjective:[facility]  Patient per nursing report was very disorganized last night. Yesterday during assessment she was very paranoid and suspicious thinking that everybody was looking at her. Her thinking at times was disorganized. We contacted the family and they were aware that the patient was not yet ready for discharge. This morning however the patient's father arrived at the unit demanding to have the patient discharged to his care as she was not improving. Family meeting was held with the father. We discussed medications, involuntary commitment and therefore the patient was staying in the hospital until a stable due to high risk of decompensation.  Patient was still labile this morning and thought process was at times very difficult to follow. She denies side effects from medications. She denies physical complaints. She denies suicidality, homicidality or hallucinations.  Per nursing:Affect pleasant on approach this am shift. Patient approach Clinical research associatewriter  Stating her doctor today is Abbi and he will give her medication . Stated also that her father was on the way  To the unit  To check her out . Patient continue to talk about going home . Patient thought process remains altered . Patient confused  At intervals during shift Patient stated slept good last night .Stated appetite fair and energy level normal. Stated concentration is good . Stated on Depression scale 0 , hopeless 1 and anxiety 2 .( low 0-10 high) Denies suicidal  homicidal ideations  .  No auditory hallucinations  No pain concerns . Appropriate ADL'S. Interacting with peers and staff.  A: Encourage patient participation with unit programming . Instruction  Given on  Medication ,  Unable  To verbalize understanding writer has repeated information during  Shift  R: Voice no other concerns. Staff continue to monitor    Principal Problem: Bipolar affective disorder, mixed,  severe, with psychotic behavior (HCC) Diagnosis:   Patient Active Problem List   Diagnosis Date Noted  . Cannabis use disorder, severe, dependence (HCC) [F12.20] 12/29/2015  . Hyperprolactinemia (HCC) [E22.1] 12/29/2015  . Tobacco use disorder [F17.200] 12/28/2015  . Bipolar affective disorder, mixed, severe, with psychotic behavior (HCC) [F31.64] 12/26/2015   Total Time spent with patient: 30 minutes  Past Psychiatric History: Patient was hospitalized in our unit in February of last year."Ms. Mulligan was brought to the Emergency Room by her family. She has been depressed, dysfunctional, and behaving in a bizarre way with confused, disorganized, psychotic thinking. The patient is not able to provide much detail. She does admit that she has many symptoms of depression with poor sleep, decreased appetite, and 10 pound weight loss, anhedonia, feeling of guilt, hopelessness, worthlessness, poor energy and concentration, crying spells, social isolation. She reports that she has been thinking of suicide for quite sometime, but is vague about trying something". She was discharged on Risperdal and prozac.  It looks like [facility] but has been without medications for about a year.   Past Medical History: History reviewed. No pertinent past medical history. History reviewed. No pertinent surgical history.   Family History: History reviewed. No pertinent family history.   Family Psychiatric  History: Patient reports having an aunt with schizophrenia. Her father was an alcoholic. There is no suicides in her family. There is an aunt who committed homicide  Social History: Patient is single, never married, doesn't have any children, she graduated from high school. She is currently working at the [employer]  has had other jobs at [employer] and [employer]. [place] She recently moved back with her parents. She denies any  legal history History  Alcohol Use  . Yes     History  Drug Use  . Types: Marijuana    Social History   Social History  . Marital status: Single    Spouse name: N/A  . Number of children: N/A  . Years of education: N/A   Social History Main Topics  . Smoking status: Current Some Day Smoker  . Smokeless tobacco: Never Used  . Alcohol use Yes  . Drug use:     Types: Marijuana  . Sexual activity: Not Asked   Other Topics Concern  . None   Social History Narrative  . None    Current Medications: Current Facility-Administered Medications  Medication Dose Route Frequency Provider Last Rate Last Dose  . acetaminophen (TYLENOL) tablet 650 mg  650 mg Oral Q6H PRN Audery AmelJohn T Clapacs, MD      . alum & mag hydroxide-simeth (MAALOX/MYLANTA) 200-200-20 MG/5ML suspension 30 mL  30 mL Oral Q4H PRN Audery AmelJohn T Clapacs, MD      . amoxicillin (AMOXIL) capsule 500 mg  500 mg Oral Q8H Jimmy FootmanAndrea Hernandez-Gonzalez, MD   500 mg at 01/05/16 1421  . lithium carbonate (ESKALITH) CR tablet 450 mg  450 mg Oral Q12H Jimmy FootmanAndrea Hernandez-Gonzalez, MD   450 mg at 01/05/16 0858  . LORazepam (ATIVAN) tablet 1 mg  1 mg Oral Q6H PRN Jimmy FootmanAndrea Hernandez-Gonzalez, MD   1 mg at 01/05/16 0309  . LORazepam (ATIVAN) tablet 2 mg  2 mg Oral QHS Jimmy FootmanAndrea Hernandez-Gonzalez, MD      . magnesium hydroxide (MILK OF MAGNESIA) suspension 30 mL  30 mL Oral Daily PRN Audery AmelJohn T Clapacs, MD      . OLANZapine (ZYPREXA) tablet 10 mg  10 mg Oral BID Jimmy FootmanAndrea Hernandez-Gonzalez, MD   10 mg at 01/05/16 16100858    Lab Results:  No results found for this or any previous visit (from the past 48 hour(s)).  Blood Alcohol level:  Lab Results  Component Value Date   ETH <5 12/26/2015    Metabolic Disorder Labs: Lab Results  Component Value Date   HGBA1C 5.1 12/28/2015   Lab Results  Component Value Date   PROLACTIN 145.4 (H) 12/28/2015   Lab Results  Component Value Date   CHOL 151 12/28/2015   TRIG 61 12/28/2015   HDL 72 12/28/2015    CHOLHDL 2.1 12/28/2015   VLDL 12 12/28/2015   LDLCALC 67 12/28/2015   LDLCALC 57 06/18/2014    Physical Findings: AIMS: Facial and Oral Movements Muscles of Facial Expression: None, normal Lips and Perioral Area: None, normal Jaw: None, normal Tongue: None, normal,Extremity Movements Upper (arms, wrists, hands, fingers): None, normal Lower (legs, knees, ankles, toes): None, normal, Trunk Movements Neck, shoulders, hips: None, normal, Overall Severity Severity of abnormal movements (highest score from questions above): None, normal Incapacitation due to abnormal movements: None, normal Patient's awareness of abnormal movements (rate only patient's report): No Awareness, Dental Status Current problems with teeth and/or dentures?: No Does patient usually wear dentures?: No  CIWA:    COWS:     Musculoskeletal: Strength & Muscle Tone: within normal limits Gait & Station: normal Patient leans: N/A  Psychiatric Specialty Exam: Physical Exam  Nursing note and vitals reviewed. Constitutional: She is oriented to person, place, and time. She appears well-developed and well-nourished.  HENT:  Head: Normocephalic and atraumatic.  Eyes: EOM are normal.  Neck: Normal range of motion.  Respiratory: Effort normal.  Musculoskeletal: Normal range of motion.  Neurological: She is oriented to person, place, and time.    Review of Systems  Constitutional: Negative.   HENT: Negative.   Eyes: Negative.   Respiratory: Negative.   Cardiovascular: Negative.   Gastrointestinal: Negative.   Genitourinary: Negative.   Musculoskeletal: Negative.   Skin: Negative.   Neurological: Negative.   Endo/Heme/Allergies: Negative.   Psychiatric/Behavioral: Positive for substance abuse. Negative for suicidal ideas. The patient is nervous/anxious and has insomnia.     Blood pressure (!) 146/81, pulse (!) 104, temperature 98 F (36.7 C), resp. rate 20, height 5\' 5"  (1.651 m), weight 59 kg (130 lb), last  menstrual period 11/24/2015, SpO2 100 %.Body mass index is 21.63 kg/m.  General Appearance: Fairly Groomed  Eye Contact:  Good  Speech:  Normal Rate  Volume:  Decreased  Mood:  Dysphoric  Affect:  Labile  Thought Process:  Irrelevant and Descriptions of Associations: Loose  Orientation:  Full (Time, Place, and Person)  Thought Content:  Hallucinations: None  Suicidal Thoughts:  No  Homicidal Thoughts:  No  Memory:  Immediate;   Poor Recent;   Poor Remote;   Poor  Judgement:  Impaired  Insight:  Lacking  Psychomotor Activity:  Increased  Concentration:  Concentration: Poor and Attention Span: Poor  Recall:  Poor  Fund of Knowledge:  Fair  Language:  Good  Akathisia:  No  Handed:    AIMS (if indicated):     Assets:  Manufacturing systems engineer Physical Health Social Support  ADL's:  Intact  Cognition:  WNL  Sleep:  Number of Hours: 4.15     Treatment Plan Summary:  Patient is presenting with symptoms of psychosis and mania. She has been given a prior diagnosis of bipolar disorder however not a potential diagnosis could be schizoaffective disorder bipolar type  For bipolar disorder the patient has been started on abilify but the patient has failed to respond to this medication and continues to be labile and disorganized, also displaying paranoia.  Abilify will be discontinued and she will be started on olanzapine 10 mg by mouth twice a day. For mood symptoms she will be continued on lithium CR 450 mg po bid   For insomnia: patient continues to only sleep 3-4 hours per night. I will order Ativan 2 mg at bedtime  For agitation: continue Ativan 0.5 mg by mouth every 4 hours as needed   Tobacco use disorder: Patient declines from receiving nicotine patch  Cannabis use disorder and past history of alcohol use: Patient continues to be in need of substance abuse treatment. Will refer to outpatient substance abuse upon discharge  Hyperprolactinemia her prolactin is 145 despite  her noncompliance with antipsychotics. MRI completed  wnl.  Tachycardia: EKG  Normal sinus rhythm with sinus arrhythmia.Normal ECG. HR 79  UTI: started on amoxacillin 500 mg tid  In need to continue for 6 additional days  Labs hemoglobin A1c, prolactin, lipid panel have been ordered. TSH is within the normal limits. Urine pregnancy neg  Disposition was stable she will be discharged back to her parents  Follow-up to be determined.  Spoke with Father on Friday, Tuesday and Wednesday.  Mother was also contacted and updated.  Jimmy Footman, MD 01/05/2016, 2:58 PM

## 2016-01-05 NOTE — Progress Notes (Signed)
Recreation Therapy Notes  Date: 08.23.17 Time: 1:00 pm Location: Craft Room  Group Topic: Self-esteem  Goal Area(s) Addresses:  Patient will identify positive traits about self. Patient will identify at least one healthy coping skill.  Behavioral Response: Did not attend  Intervention: All About Me  Activity: Patients were instructed to make an All About Me pamphlet including their life's motto, positive traits, healthy coping skills, and their support system.  Education: LRT educated patients on ways they can increase their self-esteem.  Education Outcome: Patient did not attend group.  Clinical Observations/Feedback: Patient did not attend group.  Jamilet Ambroise M, LRT/CTRS 01/05/2016 2:46 PM 

## 2016-01-05 NOTE — Plan of Care (Signed)
Problem: Coping: Goal: Ability to verbalize frustrations and anger appropriately will improve Outcome: Progressing Patient continues, to at least, talk about her frustration; observed while awake sitting up in bed with the Bible at the EOB.She said, "I want to talk to you but I want to talk to myself first in my head, is that okay? I don't need medication to help me talk to you and myself in my head." Persuaded that the Ativan 1 mg will actually help her with clarity of her thought process; Ativan 1 mg was given at [id number]

## 2016-01-05 NOTE — Tx Team (Signed)
Interdisciplinary Treatment Plan Update (Adult)         Date: 01/05/2016   Time Reviewed: 10:30 AM   Progress in Treatment: Improving Attending groups: Yes  Participating in groups: Yes  Taking medication as prescribed: Yes  Tolerating medication: Yes  Family/Significant other contact made: CSW spoke with dad, Norberto Sorenson Patient understands diagnosis: Yes  Discussing patient identified problems/goals with staff: Yes  Medical problems stabilized or resolved: Yes  Denies suicidal/homicidal ideation: Yes  Issues/concerns per patient self-inventory: Yes  Other:   New problem(s) identified: N/A   Discharge Plan or Barriers: see below   Reason for Continuation of Hospitalization:   Depression   Anxiety   Medication Stabilization   Comments: N/A   Estimated length of stay: 5 days    Patient is a 25 year old female admitted for psychosis. Patient lives in [place], [place].  25 year old woman brought in to the emergency room presumably by her family because of worsening psychosis. Most of the history obtained from the patient. She says that for some period of time probably more than a week her mood has been very bad. She has a hard time describing it but it sounds like it's been a combination of depressed and angry. She says she hasn't slept in a long time probably over a week. Her appetite has been poor. She describes having auditory hallucinations saying that there are voices telling her to kill her self. All of this makes her history sound more organized than it is. The patient is very disorganized in her thinking  Patient will benefit from crisis stabilization, medication evaluation, group therapy, and psycho education in addition to case management for discharge planning. Patient and CSW reviewed pt's identified goals and treatment plan. Pt verbalized understanding and agreed to treatment plan.    Review of initial/current patient goals per problem list:  1. Goal(s): Patient will  participate in aftercare plan   Met: Yes   Target date: 3-5 days post admission date   As evidenced by: Patient will participate within aftercare plan AEB aftercare provider and housing plan at discharge being identified.   Patient will [facility].   2. Goal (s): Patient will exhibit decreased depressive symptoms and suicidal ideations.   Met: Goal progressing   Target date: 3-5 days post admission date   As evidenced by: Patient will utilize self-rating of depression at 3 or below and demonstrate decreased signs of depression or be deemed stable for discharge by MD.   Patient reports a depression score of 4 at this time. Denies SI at this time.  3. Goal(s): Patient will demonstrate decreased signs and symptoms of anxiety.   Met: Goal progressing   Target date: 3-5 days post admission date   As evidenced by: Patient will utilize self-rating of anxiety at 3 or below and demonstrated decreased signs of anxiety, or be deemed stable for discharge by MD   Reports an anxiety score of 4 at this time.    5. Goal(s): Patient will demonstrate decreased signs of psychosis  * Met: No * Target date: 3-5 days post admission date  * As evidenced by: Patient will demonstrate decreased frequency of AVH or return to baseline function   Patient is paranoid - stated that everyone is looking at patient.   Patient: Jodi Stephens Family:  Physician: Merlyn Albert, MD    01/05/2016 10:30AM  Nursing: Polly Cobia, RN     01/05/2016 10:30AM  Clinical Social Worker: Emilie Rutter, [place]  01/05/2016 10:30AM  Other: Everitt Amber, Recreational Therapist  01/05/2016 10:30AM

## 2016-01-05 NOTE — Progress Notes (Signed)
Affect pleasant on approach this am shift. Patient approach Clinical research associatewriter  Stating her doctor today is Abbi and he will give her medication . Stated also that her father was on the way  To the unit  To check her out . Patient continue to talk about going home . Patient thought process remains altered . Patient confused  At intervals during shift Patient stated slept good last night .Stated appetite fair and energy level normal. Stated concentration is good . Stated on Depression scale 0 , hopeless 1 and anxiety 2 .( low 0-10 high) Denies suicidal  homicidal ideations  .  No auditory hallucinations  No pain concerns . Appropriate ADL'S. Interacting with peers and staff.  A: Encourage patient participation with unit programming . Instruction  Given on  Medication ,  Unable  To verbalize understanding writer has repeated information during  Shift  R: Voice no other concerns. Staff continue to monitor

## 2016-01-05 NOTE — [facility] Group Notes (Signed)
[facility] LCSW Group Therapy  01/05/2016 10:52 AM  Type of Therapy:  Group Therapy  Participation Level:  Active  Participation Quality:  Attentive  Affect:  Appropriate  Cognitive:  Alert  Insight:  Improving  Engagement in Therapy:  Engaged  Modes of Intervention:  Activity, Discussion, Education and Support  Summary of Progress/Problems:Emotional Regulation: Patients will identify both negative and positive emotions. They will discuss emotions they have difficulty regulating and how they impact their lives. Patients will be asked to identify healthy coping skills to combat unhealthy reactions to negative emotions. Pt stated listening to music when she get upsets helps her relax and feel better.    Thomasenia Dowse G. Garnette CzechSampson MSW, LCSWA 01/05/2016, 10:54 AM

## 2016-01-05 NOTE — Progress Notes (Signed)
Improved appearance, sad, quiet, denied pain, denied SI/HI, denied AV/H, thoughts are still not organized, low volume of voice and mumbled at times. Patient is pleasant, no behavioral problems. "My parent brought some books to me today ..." Will continue to provide clinical support

## 2016-01-06 NOTE — [facility] Group Notes (Signed)
[facility] LCSW Group Therapy  01/06/2016 11:15 AM  Type of Therapy:  Group Therapy  Participation Level:  Active  Participation Quality:  Appropriate and Drowsy  Affect:  Appropriate and Lethargic  Cognitive:  Alert and Appropriate  Insight:  Improving  Engagement in Therapy:  Engaged  Modes of Intervention:  Discussion, Education and Support  Summary of Progress/Problems:Balance in life: Patients will discuss the concept of balance and how it looks and feels to be unbalanced. Pt will identify areas in their life that is unbalanced and ways to become more balanced. Pt was engaged and stated she needs to find new friends that do not do drugs and drink alcohol.   Carolyn Sylvia G. Garnette CzechSampson MSW, LCSWA 01/06/2016, 11:19 AM

## 2016-01-06 NOTE — Progress Notes (Signed)
[facility] MD Progress Note  01/06/2016 9:04 AM Mc Hollen  MRN:  [id number] Subjective:  Patient per nursing report was very disorganized last night. Yesterday during assessment she was very paranoid and suspicious thinking that everybody was looking at her. Her thinking at times was disorganized. We contacted the family and they were aware that the patient was not yet ready for discharge. This morning however the patient's father arrived at the unit demanding to have the patient discharged to his care as she was not improving. Family meeting was held with the father. We discussed medications, involuntary commitment and therefore the patient was staying in the hospital until a stable due to high risk of decompensation.  Patient was calm and coopeartive this morning and thought process was less tangential but a bit pressure.  She reports some drowsiness and lightlessness when she gets up too quickly.  Finally she was able to sleep better last night.  She denies physical complaints. She denies suicidality, homicidality or hallucinations.  Per nursing:Improved appearance, sad, quiet, denied pain, denied SI/HI, denied AV/H, thoughts are still not organized, low volume of voice and mumbled at times. Patient is pleasant, no behavioral problems. "My parent brought some books to me today ..." Will continue to provide clinical support   Principal Problem: Bipolar affective disorder, mixed, severe, with psychotic behavior (HCC) Diagnosis:   Patient Active Problem List   Diagnosis Date Noted  . Cannabis use disorder, severe, dependence (HCC) [F12.20] 12/29/2015  . Hyperprolactinemia (HCC) [E22.1] 12/29/2015  . Tobacco use disorder [F17.200] 12/28/2015  . Bipolar affective disorder, mixed, severe, with psychotic behavior (HCC) [F31.64] 12/26/2015   Total Time spent with patient: 30 minutes  Past Psychiatric History: Patient was hospitalized in our unit in February of last year."Ms. Raap was brought to  the Emergency Room by her family. She has been depressed, dysfunctional, and behaving in a bizarre way with confused, disorganized, psychotic thinking. The patient is not able to provide much detail. She does admit that she has many symptoms of depression with poor sleep, decreased appetite, and 10 pound weight loss, anhedonia, feeling of guilt, hopelessness, worthlessness, poor energy and concentration, crying spells, social isolation. She reports that she has been thinking of suicide for quite sometime, but is vague about trying something". She was discharged on Risperdal and prozac.  It looks like after discharge and follow-up at [facility] behavior only briefly but has been without medications for about a year.   Past Medical History: History reviewed. No pertinent past medical history. History reviewed. No pertinent surgical history.   Family History: History reviewed. No pertinent family history.   Family Psychiatric  History: Patient reports having an aunt with schizophrenia. Her father was an alcoholic. There is no suicides in her family. There is an aunt who committed homicide  Social History: Patient is single, never married, doesn't have any children, she graduated from high school. 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 She lives by herself in an apartment in [place]. She recently moved back with her parents. She denies any legal history History  Alcohol Use  . Yes     History  Drug Use  . Types: Marijuana    Social History   Social History  . Marital status: Single    Spouse name: N/A  . Number of children: N/A  . Years of education: N/A   Social History Main Topics  . Smoking status: Current Some Day Smoker  .  Smokeless tobacco: Never Used  . Alcohol use Yes  . Drug use:     Types: Marijuana  . Sexual activity: Not Asked   Other Topics Concern  . None   Social History Narrative  . None    Current  Medications: Current Facility-Administered Medications  Medication Dose Route Frequency Provider Last Rate Last Dose  . acetaminophen (TYLENOL) tablet 650 mg  650 mg Oral Q6H PRN Audery Amel, MD      . alum & mag hydroxide-simeth (MAALOX/MYLANTA) 200-200-20 MG/5ML suspension 30 mL  30 mL Oral Q4H PRN Audery Amel, MD      . amoxicillin (AMOXIL) capsule 500 mg  500 mg Oral Q8H Jimmy Footman, MD   500 mg at 01/06/16 1610  . lithium carbonate (ESKALITH) CR tablet 450 mg  450 mg Oral Q12H Jimmy Footman, MD   450 mg at 01/05/16 2000  . LORazepam (ATIVAN) tablet 1 mg  1 mg Oral Q6H PRN Jimmy Footman, MD   1 mg at 01/05/16 0309  . LORazepam (ATIVAN) tablet 2 mg  2 mg Oral QHS Jimmy Footman, MD   2 mg at 01/05/16 2153  . magnesium hydroxide (MILK OF MAGNESIA) suspension 30 mL  30 mL Oral Daily PRN Audery Amel, MD      . OLANZapine (ZYPREXA) tablet 10 mg  10 mg Oral BID Jimmy Footman, MD   10 mg at 01/06/16 9604    Lab Results:  No results found for this or any previous visit (from the past 48 hour(s)).  Blood Alcohol level:  Lab Results  Component Value Date   ETH <5 12/26/2015    Metabolic Disorder Labs: Lab Results  Component Value Date   HGBA1C 5.1 12/28/2015   Lab Results  Component Value Date   PROLACTIN 145.4 (H) 12/28/2015   Lab Results  Component Value Date   CHOL 151 12/28/2015   TRIG 61 12/28/2015   HDL 72 12/28/2015   CHOLHDL 2.1 12/28/2015   VLDL 12 12/28/2015   LDLCALC 67 12/28/2015   LDLCALC 57 06/18/2014    Physical Findings: AIMS: Facial and Oral Movements Muscles of Facial Expression: None, normal Lips and Perioral Area: None, normal Jaw: None, normal Tongue: None, normal,Extremity Movements Upper (arms, wrists, hands, fingers): None, normal Lower (legs, knees, ankles, toes): None, normal, Trunk Movements Neck, shoulders, hips: None, normal, Overall Severity Severity of abnormal movements  (highest score from questions above): None, normal Incapacitation due to abnormal movements: None, normal Patient's awareness of abnormal movements (rate only patient's report): No Awareness, Dental Status Current problems with teeth and/or dentures?: No Does patient usually wear dentures?: No  CIWA:    COWS:     Musculoskeletal: Strength & Muscle Tone: within normal limits Gait & Station: normal Patient leans: N/A  Psychiatric Specialty Exam: Physical Exam  Nursing note and vitals reviewed. Constitutional: She is oriented to person, place, and time. She appears well-developed and well-nourished.  HENT:  Head: Normocephalic and atraumatic.  Eyes: EOM are normal.  Neck: Normal range of motion.  Respiratory: Effort normal.  Musculoskeletal: Normal range of motion.  Neurological: She is oriented to person, place, and time.    Review of Systems  Constitutional: Negative.   HENT: Negative.   Eyes: Negative.   Respiratory: Negative.   Cardiovascular: Negative.   Gastrointestinal: Negative.   Genitourinary: Negative.   Musculoskeletal: Negative.   Skin: Negative.   Neurological: Negative.   Endo/Heme/Allergies: Negative.   Psychiatric/Behavioral: Positive for substance abuse. Negative for suicidal ideas.  The patient is nervous/anxious and has insomnia.     Blood pressure 120/84, pulse (!) 107, temperature 98 F (36.7 C), temperature source Oral, resp. rate 20, height 5\' 5"  (1.651 m), weight 59 kg (130 lb), last menstrual period 11/24/2015, SpO2 100 %.Body mass index is 21.63 kg/m.  General Appearance: Fairly Groomed  Eye Contact:  Good  Speech:  Normal Rate  Volume:  Decreased  Mood:  Dysphoric  Affect:  Labile  Thought Process:  Irrelevant and Descriptions of Associations: Loose  Orientation:  Full (Time, Place, and Person)  Thought Content:  Hallucinations: None but is suspicious  Suicidal Thoughts:  No  Homicidal Thoughts:  No  Memory:  Immediate;   Poor Recent;    Poor Remote;   Poor  Judgement:  Impaired  Insight:  Lacking  Psychomotor Activity:  Increased  Concentration:  Concentration: Poor and Attention Span: Poor  Recall:  Poor  Fund of Knowledge:  Fair  Language:  Good  Akathisia:  No  Handed:    AIMS (if indicated):     Assets:  Manufacturing systems engineerCommunication Skills Physical Health Social Support  ADL's:  Intact  Cognition:  WNL  Sleep:  Number of Hours: 6.3     Treatment Plan Summary:  Patient is presenting with symptoms of psychosis and mania. She has been given a prior diagnosis of bipolar disorder however not a potential diagnosis could be schizoaffective disorder bipolar type  For bipolar disorder the patient has been started on abilify but the patient has failed to respond to this medication and continues to be labile and disorganized, also displaying paranoia.  Abilify has been discontinued and she has been started on olanzapine 10 mg by mouth twice a day. For mood symptoms she will be continued on lithium CR 450 mg po bid   For insomnia: continue ativan 2 mg po qhs.  Pt slept 6 h last night  For agitation: continue Ativan 0.5 mg by mouth every 4 hours as needed   Tobacco use disorder: Patient declines from receiving nicotine patch  Cannabis use disorder and past history of alcohol use: Patient continues to be in need of substance abuse treatment. Will refer to outpatient substance abuse upon discharge  Hyperprolactinemia her prolactin is 145 despite her noncompliance with antipsychotics. MRI completed  wnl.  Tachycardia: EKG  Normal sinus rhythm with sinus arrhythmia.Normal ECG. HR 79  UTI: started on amoxacillin 500 mg tid  In need to continue for 5 additional days  Labs hemoglobin A1c, prolactin, lipid panel have been ordered. TSH is within the normal limits. Urine pregnancy neg  Disposition was stable she will be discharged back to her parents  Follow-up to be determined.  Spoke with Father on Friday, Tuesday and  Wednesday.  Mother was also contacted and updated on 8/23  Jimmy FootmanHernandez-Gonzalez,  Patricio Popwell, MD 01/06/2016, 9:04 AM

## 2016-01-06 NOTE — [facility] Group Notes (Signed)
[facility] LCSW Group Therapy Note  Type of Therapy and Topic:  Group Therapy:  Goals Group: SMART Goals  Participation Level:  Patient attended group on this date. Patient participated in goal setting and was able to share openly with the group.   Description of Group:    The purpose of a daily goals group is to assist and guide patients in setting recovery/wellness-related goals.  The objective is to set goals as they relate to the crisis in which they were admitted. Patients will be using SMART goal modalities to set measurable goals.  Characteristics of realistic goals will be discussed and patients will be assisted in setting and processing how one will reach their goal. Facilitator will also assist patients in applying interventions and coping skills learned in psycho-education groups to the SMART goal and process how one will achieve defined goal.  Therapeutic Goals: -Patients will develop and document one goal related to or their crisis in which brought them into treatment. -Patients will be guided by LCSW using SMART goal setting modality in how to set a measurable, attainable, realistic and time sensitive goal.  -Patients will process barriers in reaching goal. -Patients will process interventions in how to overcome and successful in reaching goal.   Summary of Patient Progress:  Patient Goal: Patient goal is to return to school and earn a degree psychology.    Therapeutic Modalities:   Motivational Interviewing  Engineer, manufacturing systemsCognitive Behavioral Therapy Crisis Intervention Model SMART goals setting  Jodi Stephens MSW, LCSWA 01/06/2016 11:15 AM

## 2016-01-06 NOTE — Plan of Care (Signed)
Problem: Education: Goal: Will be free of psychotic symptoms Outcome: Progressing Patient able to let staff know her concerns

## 2016-01-06 NOTE — [facility] Group Notes (Signed)
[facility] Group Notes:  (Nursing/MHT/Case Management/Adjunct)  Date:  01/06/2016  Time:  4:15 PM  Type of Therapy:  Psychoeducational Skills  Participation Level:  Active  Participation Quality:  Attentive and Supportive  Affect:  Blunted  Cognitive:  Appropriate  Insight:  Improving  Engagement in Group:  Engaged and Limited  Modes of Intervention:  Discussion and Education  Summary of Progress/Problems:  Jodi Stephens 01/06/2016, 4:15 PM

## 2016-01-06 NOTE — Progress Notes (Signed)
Recreation Therapy Notes  Date: 08.24.17 Time: 1:00 pm Location: Craft Room  Group Topic: Leisure Education  Goal Area(s) Addresses:  Patient will identify activities for each letter of the alphabet. Patient will verbalize ability to integrate leisure into life post d/c. Patient will verbalize ability to use leisure as a Associate Professorcoping skill.  Behavioral Response: Attentive, Interactive, Off topic  Intervention: Leisure Alphabet  Activity: Patients were given a Leisure Information systems managerAlphabet worksheet and instructed to write healthy leisure activities for each letter of the alphabet.  Education: LRT educated patients on what they need to participate in leisure activities.   Education Outcome: In group clarification offered  Clinical Observations/Feedback: Patient completed activity by writing healthy leisure activities. Patient contributed to group discussion by stating some healthy leisure activities. Patient was a little off topic with the rest of the group discussion.  Jacquelynn CreeGreene,Syrianna Schillaci M, LRT/CTRS 01/06/2016 4:13 PM

## 2016-01-06 NOTE — Progress Notes (Signed)
D:Patient wrote as a goal  " Come  To terms of being a scisoprinic" : Patient stated slept good last night .Stated appetite is good and energy level  Is normal. Stated concentration is good . Stated on Depression scale 0 , hopeless 0 and anxiety 0 .( low 0-10 high) Denies suicidal  homicidal ideations  .  Patient talks to self through out shift.  No pain concerns . Appropriate ADL'S. Interacting with peers and staff.  A: Encourage patient participation with unit programming . Instruction  Given on  Medication , verbalize understanding. R: Voice no other concerns. Staff continue to monitor

## 2016-01-07 NOTE — Plan of Care (Signed)
Problem: Education: Goal: Emotional status will improve Outcome: Progressing Patient smiling, happy. Denies all

## 2016-01-07 NOTE — Progress Notes (Signed)
D: Pt is pleasant and cooperative this sift. She reports having a long great conversation with Dad." She rates anxiety 1/0 and depression 4/10. Denies SI/HI/AVH at this time.  A: Emotional support and encouragement provided. Medications administered with education. q15 minute safety checks maintained. R: Pt remains free from harm. Will continue to monitor

## 2016-01-07 NOTE — Progress Notes (Signed)
Recreation Therapy Notes  Date: 08.25.17 Time: 9:30 am Location: Craft Room  Group Topic: Coping Skills  Goal Area(s) Addresses:  Patient will participate in healthy coping skill. Patient will verbalize use of art as a coping skill.  Behavioral Response: Attentive, Left early  Intervention: Coloring  Activity: Patients were given coloring sheets to color. Patients were instructed to think about the emotions they were feeling and what their mind was focused on while they were coloring.  Education: LRT educated patients on healthy coping skills.  Education Outcome: Patient left before LRT educated group.   Clinical Observations/Feedback: Patient colored coloring sheet. Patient left group at approximately 10:02 am. Patient did not return to group.  Jacquelynn CreeGreene,Antwonette Feliz M, LRT/CTRS 01/07/2016 10:12 AM

## 2016-01-07 NOTE — Plan of Care (Signed)
Problem: Activity: Goal: Interest or engagement in activities will improve Outcome: Progressing Pt attends groups on the unit. She discusses with Clinical research associatewriter her desire to become a hip hop dancer.  Problem: Medication: Goal: Compliance with prescribed medication regimen will improve Outcome: Progressing Pt taking medications as prescribed.

## 2016-01-07 NOTE — Plan of Care (Signed)
Problem: Coping: Goal: Ability to verbalize feelings will improve Outcome: Progressing Pt becomes tearful in the medication room when discussing her parents. She states that she wants to make them proud because she is proud to be their daughter.  Problem: Self-Concept: Goal: Ability to disclose and discuss suicidal ideas will improve Outcome: Progressing Pt denies SI at this time.

## 2016-01-07 NOTE — Progress Notes (Signed)
[facility] MD Progress Note  01/07/2016 8:54 AM Jodi Stephens  MRN:  [id number] Subjective:  Patient per nursing report was very disorganized last night. Yesterday during assessment she was very paranoid and suspicious thinking that everybody was looking at her. Her thinking at times was disorganized. We contacted the family and they were aware that the patient was not yet ready for discharge. This morning however the patient's father arrived at the unit demanding to have the patient discharged to his care as she was not improving. Family meeting was held with the father. We discussed medications, involuntary commitment and therefore the patient was staying in the hospital until a stable due to high risk of decompensation.  Patient was calm and coopeartive this morning and thought process was again hard to follow.  Pt started talking about [employer] and [employer].  For the last 2 days patient has been talking about in the future becoming at therapist, a psychiatrist and a Horticulturist, commercial. She reports drowsiness but no longer feeling light headed.  Patient has been able to sleep for more than 6 hours for 2 nights in a row. She denies physical complaints. She denies suicidality, homicidality or hallucinations.  Per nursing: D: Pt is pleasant and cooperative this evening. She brightens upon approach. Pt discusses with writer her long term goal to go back to school and become a hip hop dancer. She states her word to describe her mood today is "content." She rates anxiety 1/0 and depression 4/10. Denies SI/AVH at this time. Pt reports HI towards "someone in this area", referring to someone on the unit. Pt reports she gets easily annoyed by other patients and staff but is working on coping by walking away and reading her book. A: Emotional support and encouragement provided. Medications administered with education. q15 minute safety checks maintained. R: Pt remains free from harm. Will continue to  monitor.  Principal Problem: Bipolar affective disorder, mixed, severe, with psychotic behavior (HCC) Diagnosis:   Patient Active Problem List   Diagnosis Date Noted  . Cannabis use disorder, severe, dependence (HCC) [F12.20] 12/29/2015  . Hyperprolactinemia (HCC) [E22.1] 12/29/2015  . Tobacco use disorder [F17.200] 12/28/2015  . Bipolar affective disorder, mixed, severe, with psychotic behavior (HCC) [F31.64] 12/26/2015   Total Time spent with patient: 30 minutes  Past Psychiatric History: Patient was hospitalized in our unit in February of last year."Jodi Stephens was brought to the Emergency Room by her family. She has been depressed, dysfunctional, and behaving in a bizarre way with confused, disorganized, psychotic thinking. The patient is not able to provide much detail. She does admit that she has many symptoms of depression with poor sleep, decreased appetite, and 10 pound weight loss, anhedonia, feeling of guilt, hopelessness, worthlessness, poor energy and concentration, crying spells, social isolation. She reports that she has been thinking of suicide for quite sometime, but is vague about trying something". She was discharged on Risperdal and prozac.  It looks like after discharge and follow-up at [facility] behavior only briefly but has been without medications for about a year.   Past Medical History: History reviewed. No pertinent past medical history. History reviewed. No pertinent surgical history.   Family History: History reviewed. No pertinent family history.   Family Psychiatric  History: Patient reports having an aunt with schizophrenia. Her father was an alcoholic. There is no suicides in her family. There is an aunt who committed homicide  Social History: Patient is single, never married, doesn't have any children, she graduated from  high school. 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 [place]. She recently moved back with her parents. She denies any legal history History  Alcohol Use  . Yes     History  Drug Use  . Types: Marijuana    Social History   Social History  . Marital status: Single    Spouse name: N/A  . Number of children: N/A  . Years of education: N/A   Social History Main Topics  . Smoking status: Current Some Day Smoker  . Smokeless tobacco: Never Used  . Alcohol use Yes  . Drug use:     Types: Marijuana  . Sexual activity: Not Asked   Other Topics Concern  . None   Social History Narrative  . None    Current Medications: Current Facility-Administered Medications  Medication Dose Route Frequency Provider Last Rate Last Dose  . acetaminophen (TYLENOL) tablet 650 mg  650 mg Oral Q6H PRN Audery AmelJohn T Clapacs, MD      . alum & mag hydroxide-simeth (MAALOX/MYLANTA) 200-200-20 MG/5ML suspension 30 mL  30 mL Oral Q4H PRN Audery AmelJohn T Clapacs, MD      . amoxicillin (AMOXIL) capsule 500 mg  500 mg Oral Q8H Jimmy FootmanAndrea Hernandez-Gonzalez, MD   500 mg at 01/07/16 0631  . lithium carbonate (ESKALITH) CR tablet 450 mg  450 mg Oral Q12H Jimmy FootmanAndrea Hernandez-Gonzalez, MD   450 mg at 01/07/16 [id number]  . LORazepam (ATIVAN) tablet 1 mg  1 mg Oral Q6H PRN Jimmy FootmanAndrea Hernandez-Gonzalez, MD   1 mg at 01/05/16 0309  . LORazepam (ATIVAN) tablet 2 mg  2 mg Oral QHS Jimmy FootmanAndrea Hernandez-Gonzalez, MD   2 mg at 01/06/16 2130  . magnesium hydroxide (MILK OF MAGNESIA) suspension 30 mL  30 mL Oral Daily PRN Audery AmelJohn T Clapacs, MD      . OLANZapine (ZYPREXA) tablet 10 mg  10 mg Oral BID Jimmy FootmanAndrea Hernandez-Gonzalez, MD   10 mg at 01/07/16 [id number]    Lab Results:  No results found for this or any previous visit (from the past 48 hour(s)).  Blood Alcohol level:  Lab Results  Component Value Date   ETH <5 12/26/2015    Metabolic Disorder Labs: Lab Results  Component Value Date   HGBA1C 5.1 12/28/2015   Lab Results  Component Value Date   PROLACTIN 145.4 (H) 12/28/2015   Lab Results   Component Value Date   CHOL 151 12/28/2015   TRIG 61 12/28/2015   HDL 72 12/28/2015   CHOLHDL 2.1 12/28/2015   VLDL 12 12/28/2015   LDLCALC 67 12/28/2015   LDLCALC 57 06/18/2014    Physical Findings: AIMS: Facial and Oral Movements Muscles of Facial Expression: None, normal Lips and Perioral Area: None, normal Jaw: None, normal Tongue: None, normal,Extremity Movements Upper (arms, wrists, hands, fingers): None, normal Lower (legs, knees, ankles, toes): None, normal, Trunk Movements Neck, shoulders, hips: None, normal, Overall Severity Severity of abnormal movements (highest score from questions above): None, normal Incapacitation due to abnormal movements: None, normal Patient's awareness of abnormal movements (rate only patient's report): No Awareness, Dental Status Current problems with teeth and/or dentures?: No Does patient usually wear dentures?: No  CIWA:    COWS:     Musculoskeletal: Strength & Muscle Tone: within normal limits Gait & Station: normal Patient leans: N/A  Psychiatric Specialty Exam: Physical Exam  Nursing note and vitals reviewed. Constitutional: She is oriented to  person, place, and time. She appears well-developed and well-nourished.  HENT:  Head: Normocephalic and atraumatic.  Eyes: EOM are normal.  Neck: Normal range of motion.  Respiratory: Effort normal.  Musculoskeletal: Normal range of motion.  Neurological: She is oriented to person, place, and time.    Review of Systems  Constitutional: Negative.   HENT: Negative.   Eyes: Negative.   Respiratory: Negative.   Cardiovascular: Negative.   Gastrointestinal: Negative.   Genitourinary: Negative.   Musculoskeletal: Negative.   Skin: Negative.   Neurological: Negative.   Endo/Heme/Allergies: Negative.   Psychiatric/Behavioral: Positive for substance abuse. Negative for suicidal ideas. The patient is nervous/anxious and has insomnia.     Blood pressure (!) 139/91, pulse (!) 106,  temperature 97.6 F (36.4 C), temperature source Oral, resp. rate 20, height 5\' 5"  (1.651 m), weight 59 kg (130 lb), last menstrual period 11/24/2015, SpO2 100 %.Body mass index is 21.63 kg/m.  General Appearance: Fairly Groomed  Eye Contact:  Good  Speech:  Normal Rate  Volume:  Decreased  Mood:  Euthymic  Affect:  Appropriate and Congruent  Thought Process:  Irrelevant and Descriptions of Associations: Loose  Orientation:  Full (Time, Place, and Person)  Thought Content:  Hallucinations: None   Suicidal Thoughts:  No  Homicidal Thoughts:  No  Memory:  Immediate;   Poor Recent;   Poor Remote;   Poor  Judgement:  Impaired  Insight:  Lacking  Psychomotor Activity:  Decreased  Concentration:  Concentration: Poor and Attention Span: Poor  Recall:  Poor  Fund of Knowledge:  Fair  Language:  Good  Akathisia:  No  Handed:    AIMS (if indicated):     Assets:  Manufacturing systems engineer Physical Health Social Support  ADL's:  Intact  Cognition:  WNL  Sleep:  Number of Hours: 6.5     Treatment Plan Summary:  Patient is presenting with symptoms of psychosis and mania. She has been given a prior diagnosis of bipolar disorder however not a potential diagnosis could be schizoaffective disorder bipolar type  For bipolar disorder the patient has been started on abilify but the patient has failed to respond to this medication and continues to be labile and disorganized, also displaying paranoia.  Abilify has been discontinued and she has been started on olanzapine 10 mg by mouth twice a day. For mood symptoms she will be continued on lithium CR 450 mg po bid   For insomnia: continue ativan 2 mg po qhs.  Pt slept 6.5 h last night  For agitation: continue Ativan 0.5 mg by mouth every 4 hours as needed   Tobacco use disorder: Patient declines from receiving nicotine patch  Cannabis use disorder and past history of alcohol use: Patient continues to be in need of substance abuse treatment.  Will refer to outpatient substance abuse upon discharge  Hyperprolactinemia her prolactin is 145 despite her noncompliance with antipsychotics. MRI completed  wnl.  Tachycardia: EKG  Normal sinus rhythm with sinus arrhythmia.Normal ECG. HR 79  UTI: started on amoxacillin 500 mg tid  In need to continue for 4 additional days  Labs hemoglobin A1c, prolactin, lipid panel have been ordered. TSH is within the normal limits. Urine pregnancy neg  Disposition was stable she will be discharged back to her parents  Follow-up to be determined.  Spoke with Father on Friday, Tuesday and Wednesday, spoke with mother also on 8/23. Updated parents on 8/25  Labs: will order lithium level on Sunday am  Jimmy Footman, MD  01/07/2016, 8:54 AM

## 2016-01-07 NOTE — Tx Team (Signed)
Interdisciplinary Treatment Plan Update (Adult)         Date: 01/07/2016   Time Reviewed: 10:30 AM   Progress in Treatment: Improving Attending groups: Yes  Participating in groups: Yes  Taking medication as prescribed: Yes  Tolerating medication: Yes  Family/Significant other contact made: CSW spoke with dad, Jodi Stephens Patient understands diagnosis: Yes  Discussing patient identified problems/goals with staff: Yes  Medical problems stabilized or resolved: Yes  Denies suicidal/homicidal ideation: Yes  Issues/concerns per patient self-inventory: Yes  Other:   New problem(s) identified: N/A   Discharge Plan or Barriers: see below   Reason for Continuation of Hospitalization:   Depression   Anxiety   Medication Stabilization   Comments: N/A   Estimated length of stay: 5 days    Patient is a 25 year old female admitted for psychosis. Patient lives in [place], [place].  25 year old woman brought in to the emergency room presumably by her family because of worsening psychosis. Most of the history obtained from the patient. She says that for some period of time probably more than a week her mood has been very bad. She has a hard time describing it but it sounds like it's been a combination of depressed and angry. She says she hasn't slept in a long time probably over a week. Her appetite has been poor. She describes having auditory hallucinations saying that there are voices telling her to kill her self. All of this makes her history sound more organized than it is. The patient is very disorganized in her thinking  Patient will benefit from crisis stabilization, medication evaluation, group therapy, and psycho education in addition to case management for discharge planning. Patient and CSW reviewed pt's identified goals and treatment plan. Pt verbalized understanding and agreed to treatment plan.    Review of initial/current patient goals per problem list:  1. Goal(s): Patient will  participate in aftercare plan   Met: Yes   Target date: 3-5 days post admission date   As evidenced by: Patient will participate within aftercare plan AEB aftercare provider and housing plan at discharge being identified.   Patient will follow-up with [facility].   2. Goal (s): Patient will exhibit decreased depressive symptoms and suicidal ideations.   Met: Goal progressing   Target date: 3-5 days post admission date   As evidenced by: Patient will utilize self-rating of depression at 3 or below and demonstrate decreased signs of depression or be deemed stable for discharge by MD.   Patient reports a depression score of 4 at this time. Denies SI at this time.  3. Goal(s): Patient will demonstrate decreased signs and symptoms of anxiety.   Met: Goal progressing   Target date: 3-5 days post admission date   As evidenced by: Patient will utilize self-rating of anxiety at 3 or below and demonstrated decreased signs of anxiety, or be deemed stable for discharge by MD   Reports an anxiety score of 4 at this time.    5. Goal(s): Patient will demonstrate decreased signs of psychosis  * Met: Goal progressing * Target date: 3-5 days post admission date  * As evidenced by: Patient will demonstrate decreased frequency of AVH or return to baseline function   Patient is paranoid - stated that everyone is looking at patient.   Patient: Jodi Stephens Family:  Physician: Jodi Albert, MD    01/07/2016 10:30AM  Nursing: Jodi Radon, RN     01/07/2016 10:30AM  Clinical Social Worker: Jodi Stephens, [place]  01/07/2016  10:30AM  Other: Jodi Stephens, Recreational Therapist  01/07/2016 10:30AM

## 2016-01-07 NOTE — Progress Notes (Signed)
D: Pt is pleasant and cooperative this evening. She brightens upon approach. Pt discusses with writer her long term goal to go back to school and become a hip hop dancer. She states her word to describe her mood today is "content." She rates anxiety 1/0 and depression 4/10. Denies SI/AVH at this time. Pt reports HI towards "someone in this area", referring to someone on the unit. Pt reports she gets easily annoyed by other patients and staff but is working on coping by walking away and reading her book. A: Emotional support and encouragement provided. Medications administered with education. q15 minute safety checks maintained. R: Pt remains free from harm. Will continue to monitor.

## 2016-01-08 NOTE — Progress Notes (Signed)
[facility]  01/08/2016 9:45 AM Jodi PeachMercy Larae Stephens  MRN:  [id number] Subjective:  Patient per nursing report was very disorganized last night. Yesterday during assessment she was very paranoid and suspicious thinking that everybody was looking at her. Her thinking at times was disorganized. We contacted the family and they were aware that the patient was not yet ready for discharge. This morning however the patient's father arrived at the unit demanding to have the patient discharged to his care as she was not improving. Family meeting was held with the father. We discussed medications, involuntary commitment and therefore the patient was staying in the hospital until a stable due to high risk of decompensation.  Patient was calm and coopeartive this morning.  Patient's thought process was much less disorganized today. She is still talking about random subjects. She has been is sleeping better for the last 3 nights, last night she slept at least 5.5 hours. She appears somewhat sedated during the day. She denies side effects from medications. She denies any physical complaints. She denies suicidality, homicidality or having any auditory or visual hallucinations. Speech is no longer pressure. Vision has been compliant with medications and has been attending programming. Family was contacted yesterday in the field she is progressing  Per nursing: D: Pt spends most of the evening in the dayroom. She becomes tearful when talking about her parents, stating "I just want to make them proud." Pt reports that she occasionally hears her mother's voice telling her to "keep going." She does deny SI/HI this evening.  A: Emotional support and encouragement provided. Medications administered with education. q15 minute safety checks maintained. R: Pt remains free from harm. Will continue to monitor.  Principal Problem: Bipolar affective disorder, mixed, severe, with psychotic behavior (HCC) Diagnosis:    Patient Active Problem List   Diagnosis Date Noted  . Cannabis use disorder, severe, dependence (HCC) [F12.20] 12/29/2015  . Hyperprolactinemia (HCC) [E22.1] 12/29/2015  . Tobacco use disorder [F17.200] 12/28/2015  . Bipolar affective disorder, mixed, severe, with psychotic behavior (HCC) [F31.64] 12/26/2015   Total Time spent with patient: 30 minutes  Past Psychiatric History: Patient was hospitalized in our unit in February of last year."[name] was brought to the Emergency Room by her family. She has been depressed, dysfunctional, and behaving in a bizarre way with confused, disorganized, psychotic thinking. The patient is not able to provide much detail. She does admit that she has many symptoms of depression with poor sleep, decreased appetite, and 10 pound weight loss, anhedonia, feeling of guilt, hopelessness, worthlessness, poor energy and concentration, crying spells, social isolation. She reports that she has been thinking of suicide for quite sometime, but is vague about trying something". She was discharged on Risperdal and prozac.  It looks like after discharge and [facility] only briefly but has been without medications for about a year.   Past Medical History: History reviewed. No pertinent past medical history. History reviewed. No pertinent surgical history.   Family History: History reviewed. No pertinent family history.   Family Psychiatric  History: Patient reports having an aunt with schizophrenia. Her father was an alcoholic. There is no suicides in her family. There is an aunt who committed homicide  Social History: Patient is single, never married, doesn't have any children, she graduated from high school. 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 She lives by herself in an apartment [place]. She recently moved back  with her parents. She denies any legal history History  Alcohol Use  . Yes      History  Drug Use  . Types: Marijuana    Social History   Social History  . Marital status: Single    Spouse name: N/A  . Number of children: N/A  . Years of education: N/A   Social History Main Topics  . Smoking status: Current Some Day Smoker  . Smokeless tobacco: Never Used  . Alcohol use Yes  . Drug use:     Types: Marijuana  . Sexual activity: Not Asked   Other Topics Concern  . None   Social History Narrative  . None    Current Medications: Current Facility-Administered Medications  Medication Dose Route Frequency Provider Last Rate Last Dose  . acetaminophen (TYLENOL) tablet 650 mg  650 mg Oral Q6H PRN Audery Amel, MD      . alum & mag hydroxide-simeth (MAALOX/MYLANTA) 200-200-20 MG/5ML suspension 30 mL  30 mL Oral Q4H PRN Audery Amel, MD      . amoxicillin (AMOXIL) capsule 500 mg  500 mg Oral Q8H Jimmy Footman, MD   500 mg at 01/08/16 0630  . lithium carbonate (ESKALITH) CR tablet 450 mg  450 mg Oral Q12H Jimmy Footman, MD   450 mg at 01/08/16 0851  . LORazepam (ATIVAN) tablet 1 mg  1 mg Oral Q6H PRN Jimmy Footman, MD   1 mg at 01/05/16 0309  . LORazepam (ATIVAN) tablet 2 mg  2 mg Oral QHS Jimmy Footman, MD   2 mg at 01/07/16 2217  . magnesium hydroxide (MILK OF MAGNESIA) suspension 30 mL  30 mL Oral Daily PRN Audery Amel, MD      . OLANZapine (ZYPREXA) tablet 10 mg  10 mg Oral BID Jimmy Footman, MD   10 mg at 01/08/16 4540    Lab Results:  No results found for this or any previous visit (from the past 48 hour(s)).  Blood Alcohol level:  Lab Results  Component Value Date   ETH <5 12/26/2015    Metabolic Disorder Labs: Lab Results  Component Value Date   HGBA1C 5.1 12/28/2015   Lab Results  Component Value Date   PROLACTIN 145.4 (H) 12/28/2015   Lab Results  Component Value Date   CHOL 151 12/28/2015   TRIG 61 12/28/2015   HDL 72 12/28/2015   CHOLHDL 2.1 12/28/2015   VLDL 12  12/28/2015   LDLCALC 67 12/28/2015   LDLCALC 57 06/18/2014    Physical Findings: AIMS: Facial and Oral Movements Muscles of Facial Expression: None, normal Lips and Perioral Area: None, normal Jaw: None, normal Tongue: None, normal,Extremity Movements Upper (arms, wrists, hands, fingers): None, normal Lower (legs, knees, ankles, toes): None, normal, Trunk Movements Neck, shoulders, hips: None, normal, Overall Severity Severity of abnormal movements (highest score from questions above): None, normal Incapacitation due to abnormal movements: None, normal Patient's awareness of abnormal movements (rate only patient's report): No Awareness, Dental Status Current problems with teeth and/or dentures?: No Does patient usually wear dentures?: No  CIWA:    COWS:     Musculoskeletal: Strength & Muscle Tone: within normal limits Gait & Station: normal Patient leans: N/A  Psychiatric Specialty Exam: Physical Exam  Nursing note and vitals reviewed. Constitutional: She is oriented to person, place, and time. She appears well-developed and well-nourished.  HENT:  Head: Normocephalic and atraumatic.  Eyes: EOM are normal.  Neck: Normal range of motion.  Respiratory: Effort normal.  Musculoskeletal: Normal range of motion.  Neurological: She is oriented to person, place, and time.    Review of Systems  Constitutional: Negative.   HENT: Negative.   Eyes: Negative.   Respiratory: Negative.   Cardiovascular: Negative.   Gastrointestinal: Negative.   Genitourinary: Negative.   Musculoskeletal: Negative.   Skin: Negative.   Neurological: Negative.   Endo/Heme/Allergies: Negative.   Psychiatric/Behavioral: Positive for substance abuse. Negative for suicidal ideas. The patient is nervous/anxious and has insomnia.     Blood pressure 123/90, pulse (!) 101, temperature 98.3 F (36.8 C), temperature source Oral, resp. rate 18, height 5\' 5"  (1.651 m), weight 59 kg (130 lb), last menstrual  period 11/24/2015, SpO2 100 %.Body mass index is 21.63 kg/m.  General Appearance: Fairly Groomed  Eye Contact:  Good  Speech:  Normal Rate  Volume:  Decreased  Mood:  Euthymic  Affect:  Appropriate and Congruent  Thought Process:  Irrelevant and Descriptions of Associations: Loose  Orientation:  Full (Time, Place, and Person)  Thought Content:  Hallucinations: None   Suicidal Thoughts:  No  Homicidal Thoughts:  No  Memory:  Immediate;   Poor Recent;   Poor Remote;   Poor  Judgement:  Impaired  Insight:  Lacking  Psychomotor Activity:  Decreased  Concentration:  Concentration: Poor and Attention Span: Poor  Recall:  Poor  Fund of Knowledge:  Fair  Language:  Good  Akathisia:  No  Handed:    AIMS (if indicated):     Assets:  Manufacturing systems engineer Physical Health Social Support  ADL's:  Intact  Cognition:  WNL  Sleep:  Number of Hours: 5.5     Treatment Plan Summary:  Patient is presenting with symptoms of psychosis and mania. She has been given a prior diagnosis of bipolar disorder however not a potential diagnosis could be schizoaffective disorder bipolar type  For bipolar disorder the patient has been started on abilify but the patient has failed to respond to this medication and continues to be labile and disorganized, also displaying paranoia.  Abilify has been discontinued and she has been started on olanzapine 10 mg by mouth twice a day. For mood symptoms she will be continued on lithium CR 450 mg po bid   For insomnia: continue ativan 2 mg po qhs.  Pt slept 5.5 h last night  Tobacco use disorder: Patient declines from receiving nicotine patch  Cannabis use disorder and past history of alcohol use: Patient continues to be in need of substance abuse treatment. Will refer to outpatient substance abuse upon discharge  Hyperprolactinemia her prolactin is 145 despite her noncompliance with antipsychotics. MRI completed  wnl.  Tachycardia: EKG  Normal sinus rhythm  with sinus arrhythmia.Normal ECG. HR 79  UTI: started on amoxacillin 500 mg tid  In need to continue for 3 additional days  Labs hemoglobin A1c, prolactin, lipid panel have been ordered. TSH is within the normal limits. Urine pregnancy neg  Disposition was stable she will be discharged back to her parents  Follow-up to be determined.  Spoke with Father on Friday, Tuesday and Wednesday, spoke with mother also on 8/23. Updated parents on 8/25  Labs: will order lithium level on Sunday am  Possible discharge in the next 3-5 days.  Jimmy Footman, MD 01/08/2016, 9:45 AM

## 2016-01-08 NOTE — [facility] Group Notes (Signed)
[facility] Group Notes:  (Nursing/MHT/Case Management/Adjunct)  Date:  01/08/2016  Time:  4:47 AM  Type of Therapy:  Psychoeducational Skills  Participation Level:  Active  Participation Quality:  Appropriate and Attentive  Affect:  Appropriate  Cognitive:  Appropriate  Insight:  Appropriate  Engagement in Group:  Engaged  Modes of Intervention:  Discussion, Socialization and Support  Summary of Progress/Problems:  Chancy MilroyLaquanda Y Jung Ingerson 01/08/2016, 4:47 AM

## 2016-01-08 NOTE — Progress Notes (Signed)
D: Pt spends most of the evening in the dayroom. She becomes tearful when talking about her parents, stating "I just want to make them proud." Pt reports that she occasionally hears her mother's voice telling her to "keep going." She does deny SI/HI this evening.  A: Emotional support and encouragement provided. Medications administered with education. q15 minute safety checks maintained. R: Pt remains free from harm. Will continue to monitor.

## 2016-01-08 NOTE — Progress Notes (Signed)
Patient with appropriate affect, cooperative behavior with meals, meds and plan of care. No SI/HI/AVH at this time. Quiet with peers, appropriate and pleasant when staff initiates interaction. Patient with poor insight. States her daily goal is to do more yoga.  Does verbalize understanding rt s/s of depression and importance of med compliance. Safety maintained.

## 2016-01-08 NOTE — [facility] Group Notes (Signed)
[facility] LCSW Group Therapy  01/08/2016 2:13 PM  Type of Therapy:  Group Therapy  Participation Level:  Active  Participation Quality:  Appropriate, Attentive and Sharing  Affect:  Flat and Labile  Cognitive:  Lacking  Insight:  Improving  Engagement in Therapy:  Engaged  Modes of Intervention:  Activity, Discussion, Education and Support  Summary of Progress/Problems:Communications: Patients identify how individuals communicate with one another appropriately and inappropriately. Patients will be guided to discuss their thoughts, feelings, and behaviors related to barriers when communicating. The group will process together ways to execute positive and appropriate communications. Pt stated that she experiences difficulty speaking with her parents at time. CSW offered support to pt and discussed effective communication with others.   Jodi Stephens G. Garnette CzechSampson MSW, LCSWA 01/08/2016, 2:15 PM

## 2016-01-08 NOTE — Plan of Care (Signed)
Problem: Education: Goal: Knowledge of [facility] Education information/materials will improve Outcome: Progressing Patient verbalizes understanding of s/s of depression and importance of med compliance.

## 2016-01-08 NOTE — [facility] Group Notes (Signed)
[facility] Group Notes:  (Nursing/MHT/Case Management/Adjunct)  Date:  01/08/2016  Time:  5:04 PM  Type of Therapy:  Psychoeducational Skills  Participation Level:  Active  Participation Quality:  Appropriate and Attentive  Affect:  Appropriate  Cognitive:  Alert and Appropriate  Insight:  Good  Engagement in Group:  Engaged and Supportive  Modes of Intervention:  Discussion and Education  Summary of Progress/Problems:  Jodi PennerKristen J Ameia Stephens 01/08/2016, 5:04 PM

## 2016-01-08 NOTE — [facility] Group Notes (Signed)
[facility] Group Notes:  (Nursing/MHT/Case Management/Adjunct)  Date:  01/08/2016  Time:  9:41 PM  Type of Therapy:  Group Therapy  Participation Level:  Active  Participation Quality:  Sharing  Affect:  Appropriate  Cognitive:  Appropriate  Insight:  Appropriate  Engagement in Group:  Engaged  Modes of Intervention:  Support  Summary of Progress/Problems:  Mayra NeerJackie L Keatyn Jawad 01/08/2016, 9:41 PM

## 2016-01-09 DIAGNOSIS — F250 Schizoaffective disorder, bipolar type: Secondary | ICD-10-CM

## 2016-01-09 MED ORDER — OLANZAPINE 10 MG PO TABS: 10.0000 mg | ORAL_TABLET | Freq: Two times a day (BID) | ORAL | Status: DC

## 2016-01-09 MED ORDER — AMOXICILLIN 500 MG PO CAPS: 500.0000 mg | ORAL_CAPSULE | Freq: Three times a day (TID) | ORAL | 0 refills | Status: DC

## 2016-01-09 MED ORDER — OLANZAPINE 15 MG PO TABS: 15.0000 mg | ORAL_TABLET | Freq: Every day | ORAL | 0 refills | Status: DC

## 2016-01-09 MED ORDER — OLANZAPINE 5 MG PO TABS
5.0000 mg | ORAL_TABLET | Freq: Two times a day (BID) | ORAL | Status: AC
  Administered 2016-01-09: 5 mg via ORAL
  Filled 2016-01-09: qty 1

## 2016-01-09 MED ORDER — LORAZEPAM 2 MG PO TABS: 2.0000 mg | ORAL_TABLET | Freq: Every day | ORAL | 0 refills | Status: DC

## 2016-01-09 MED ORDER — OLANZAPINE 7.5 MG PO TABS: 15.0000 mg | ORAL_TABLET | Freq: Every day | ORAL | Status: DC

## 2016-01-09 MED ORDER — LITHIUM CARBONATE ER 450 MG PO TBCR: 450.0000 mg | EXTENDED_RELEASE_TABLET | Freq: Two times a day (BID) | ORAL | 0 refills | Status: DC

## 2016-01-09 MED ORDER — AMANTADINE HCL 100 MG PO CAPS
100.0000 mg | ORAL_CAPSULE | Freq: Two times a day (BID) | ORAL | Status: DC
  Administered 2016-01-09 – 2016-01-10 (×3): 100 mg via ORAL
  Filled 2016-01-09 (×3): qty 1

## 2016-01-09 MED ORDER — AMANTADINE HCL 100 MG PO CAPS: 100.0000 mg | ORAL_CAPSULE | Freq: Two times a day (BID) | ORAL | 0 refills | Status: DC

## 2016-01-09 NOTE — Plan of Care (Signed)
Problem: Coping: Goal: Ability to demonstrate self-control will improve Outcome: Progressing Patient has demonstrated self control by using coping skills [place] RN

## 2016-01-09 NOTE — Progress Notes (Addendum)
D: Pt denies SI/HI/AVH. Pt is pleasant and cooperative. Pt stated her dreams have started back, pt was encouraged to talk with thr Dr about possibly starting something to help her with dreams. Pt has flight of ideas at times and appears disorganized, but pt is oriented x3 . Pt paranoid and bizarre at times. Pt stated she has issues with her short term memory.   A: Pt was offered support and encouragement. Pt was given scheduled medications. Pt was encourage to attend groups. Q 15 minute checks were done for safety.   R:Pt attends groups and interacts well with peers and staff. Pt is taking medication. Pt has no complaints at this time .Pt receptive to treatment and safety maintained on unit.

## 2016-01-09 NOTE — [facility] Suicide Risk Assessment (Signed)
[facility]   Principal Problem: Schizoaffective disorder, bipolar type [facility](HCC) Discharge Diagnoses:  Patient Active Problem List   Diagnosis Date Noted  . Schizoaffective disorder, bipolar type (HCC) [F25.0] 01/09/2016  . Cannabis use disorder, severe, dependence (HCC) [F12.20] 12/29/2015  . Hyperprolactinemia (HCC) [E22.1] 12/29/2015  . Tobacco use disorder [F17.200] 12/28/2015      Psychiatric Specialty Exam: ROS  Blood pressure 129/79, pulse 98, temperature 98.9 F (37.2 C), temperature source Oral, resp. rate 16, height 5\' 5"  (1.651 m), weight 59 kg (130 lb), last menstrual period 11/24/2015, SpO2 100 %.Body mass index is 21.63 kg/m.                                                       Mental Status Per Nursing Assessment::   On Admission:     Demographic Factors:  Adolescent or young adult  Loss Factors: NA  Historical Factors: NA  Risk Reduction Factors:   Sense of responsibility to family, Living with another person, especially a relative and Positive social support  Continued Clinical Symptoms:  Alcohol/Substance Abuse/Dependencies Previous Psychiatric Diagnoses and Treatments  Cognitive Features That Contribute To Risk:  None    Suicide Risk:  Minimal: No identifiable suicidal ideation.  Patients presenting with no risk factors but with morbid ruminations; may be classified as minimal risk based on the severity of the depressive symptoms  Follow-up Information    [facility]. Go on 01/10/2016.   Why:  Please arrive to the walk-in clinic between the hours of 9am-2:30pm for your hospital follow-up appointment. Please bring discharge paperwork. Arrive as early as possible for prompt service.  Contact information: [address], [place], [place] [address] Phone: [phone] Fax: [phone]            Jimmy FootmanHernandez-Gonzalez,  Siobhan Zaro, MD 01/10/2016, 8:53 AM
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
## 2016-01-09 NOTE — [facility] Group Notes (Signed)
[facility] LCSW Group Therapy  01/09/2016 2:14 PM  Type of Therapy:  Group Therapy  Participation Level:  Active  Participation Quality:  Appropriate and Drowsy  Affect:  Appropriate and Lethargic  Cognitive:  Appropriate  Insight:  Developing/Improving  Engagement in Therapy:  Improving  Modes of Intervention:  Activity, Discussion, Reality Testing and Support  Summary of Progress/Problems: Emotional Regulation: Patients will identify both negative and positive emotions. They will discuss emotions they have difficulty regulating and how they impact their lives. Patients will be asked to identify healthy coping skills to combat unhealthy reactions to negative emotions. Pt shared that she has improved her mood since being in the hospital and that attending groups had helped her manage her negative thoughts. 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   Artice Bergerson G. Garnette CzechSampson MSW, LCSWA 01/09/2016, 2:18 PM

## 2016-01-09 NOTE — Progress Notes (Signed)
D: Patient is alert and oriented on the unit this shift. Patient attended and actively participated in groups today. Patient denies suicidal ideation, homicidal ideation, auditory or visual hallucinations at the present time.  A: Scheduled medications are administered to patient as per MD orders. Emotional support and encouragement are provided. Patient is maintained on q.15 minute safety checks. Patient is informed to notify staff with questions or concerns. R: No adverse medication reactions are noted. Patient is cooperative with medication administration and treatment plan today. Patient is receptive, anxious but cooperative on the unit at this time. Patient interacts   with others on the unit this shift. Patient contracts for safety at this time. Patient remains safe at this time.

## 2016-01-09 NOTE — Progress Notes (Addendum)
[facility] MD Progress Note  01/09/2016 9:57 AM Enedelia Martorelli  MRN:  [id number] Subjective:  Patient per nursing report was very disorganized last night. Yesterday during assessment she was very paranoid and suspicious thinking that everybody was looking at her. Her thinking at times was disorganized. We contacted the family and they were aware that the patient was not yet ready for discharge. This morning however the patient's father arrived at the unit demanding to have the patient discharged to his care as she was not improving. Family meeting was held with the father. We discussed medications, involuntary commitment and therefore the patient was staying in the hospital until a stable due to high risk of decompensation.  Patient was calm and coopeartive this morning.  Patient's thought process was linear and organized. Spoke with parents and they feel she is improving. She has been is sleeping better for the last several days. No longer appears drowsy. She denies side effects from medications. She denies any physical complaints. She denies suicidality, homicidality or having any auditory or visual hallucinations. Speech is no longer pressure. Vision has been compliant with medications and has been attending programming.   Per nursing: D: Patient is alert and oriented on the unit this shift. Patient attended and actively participated in groups today. Patient denies suicidal ideation, homicidal ideation, auditory or visual hallucinations at the present time.  A: Scheduled medications are administered to patient as per MD orders. Emotional support and encouragement are provided. Patient is maintained on q.15 minute safety checks. Patient is informed to notify staff with questions or concerns. R: No adverse medication reactions are noted. Patient is cooperative with medication administration and treatment plan today. Patient is receptive, anxious but cooperative on the unit at this time. Patient interacts    with others on the unit this shift. Patient contracts for safety at this time. Patient remains safe at this time.   Principal Problem: Bipolar affective disorder, mixed, severe, with psychotic behavior (HCC) Diagnosis:   Patient Active Problem List   Diagnosis Date Noted  . Cannabis use disorder, severe, dependence (HCC) [F12.20] 12/29/2015  . Hyperprolactinemia (HCC) [E22.1] 12/29/2015  . Tobacco use disorder [F17.200] 12/28/2015  . Bipolar affective disorder, mixed, severe, with psychotic behavior (HCC) [F31.64] 12/26/2015   Total Time spent with patient: 30 minutes  Past Psychiatric History: Patient was hospitalized in our unit in February of last year."Ms. Kindall was brought to the Emergency Room by her family. She has been depressed, dysfunctional, and behaving in a bizarre way with confused, disorganized, psychotic thinking. The patient is not able to provide much detail. She does admit that she has many symptoms of depression with poor sleep, decreased appetite, and 10 pound weight loss, anhedonia, feeling of guilt, hopelessness, worthlessness, poor energy and concentration, crying spells, social isolation. She reports that she has been thinking of suicide for quite sometime, but is vague about trying something". She was discharged on Risperdal and prozac.  It looks like after discharge and follow-up at [facility] behavior only briefly but has been without medications for about a year.   Past Medical History: History reviewed. No pertinent past medical history. History reviewed. No pertinent surgical history.   Family History: History reviewed. No pertinent family history.   Family Psychiatric  History: Patient reports having an aunt with schizophrenia. Her father was an alcoholic. There is no suicides in her family. There is an aunt who committed homicide  Social History: Patient is single, never married, doesn't have any children, she graduated from  high school. 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 She lives by herself in an apartment in [place]. She recently moved back with her parents. She denies any legal history History  Alcohol Use  . Yes     History  Drug Use  . Types: Marijuana    Social History   Social History  . Marital status: Single    Spouse name: N/A  . Number of children: N/A  . Years of education: N/A   Social History Main Topics  . Smoking status: Current Some Day Smoker  . Smokeless tobacco: Never Used  . Alcohol use Yes  . Drug use:     Types: Marijuana  . Sexual activity: Not Asked   Other Topics Concern  . None   Social History Narrative  . None    Current Medications: Current Facility-Administered Medications  Medication Dose Route Frequency Provider Last Rate Last Dose  . acetaminophen (TYLENOL) tablet 650 mg  650 mg Oral Q6H PRN Audery Amel, MD      . alum & mag hydroxide-simeth (MAALOX/MYLANTA) 200-200-20 MG/5ML suspension 30 mL  30 mL Oral Q4H PRN Audery Amel, MD      . amoxicillin (AMOXIL) capsule 500 mg  500 mg Oral Q8H Jimmy Footman, MD   500 mg at 01/09/16 0553  . lithium carbonate (ESKALITH) CR tablet 450 mg  450 mg Oral Q12H Jimmy Footman, MD   450 mg at 01/09/16 0848  . LORazepam (ATIVAN) tablet 2 mg  2 mg Oral QHS Jimmy Footman, MD   2 mg at 01/08/16 2142  . magnesium hydroxide (MILK OF MAGNESIA) suspension 30 mL  30 mL Oral Daily PRN Audery Amel, MD      . Melene Muller ON 01/10/2016] OLANZapine (ZYPREXA) tablet 15 mg  15 mg Oral QHS Jimmy Footman, MD      . OLANZapine (ZYPREXA) tablet 5 mg  5 mg Oral BID Jimmy Footman, MD        Lab Results:  No results found for this or any previous visit (from the past 48 hour(s)).  Blood Alcohol level:  Lab Results  Component Value Date   ETH <5 12/26/2015    Metabolic Disorder Labs: Lab Results  Component Value Date   HGBA1C 5.1 12/28/2015   Lab  Results  Component Value Date   PROLACTIN 145.4 (H) 12/28/2015   Lab Results  Component Value Date   CHOL 151 12/28/2015   TRIG 61 12/28/2015   HDL 72 12/28/2015   CHOLHDL 2.1 12/28/2015   VLDL 12 12/28/2015   LDLCALC 67 12/28/2015   LDLCALC 57 06/18/2014    Physical Findings: AIMS: Facial and Oral Movements Muscles of Facial Expression: None, normal Lips and Perioral Area: None, normal Jaw: None, normal Tongue: None, normal,Extremity Movements Upper (arms, wrists, hands, fingers): None, normal Lower (legs, knees, ankles, toes): None, normal, Trunk Movements Neck, shoulders, hips: None, normal, Overall Severity Severity of abnormal movements (highest score from questions above): None, normal Incapacitation due to abnormal movements: None, normal Patient's awareness of abnormal movements (rate only patient's report): No Awareness, Dental Status Current problems with teeth and/or dentures?: No Does patient usually wear dentures?: No  CIWA:    COWS:     Musculoskeletal: Strength & Muscle Tone: within normal limits Gait & Station: normal Patient leans: N/A  Psychiatric Specialty Exam: Physical Exam  Nursing note and vitals reviewed. Constitutional: She is oriented to person, place, and time.  She appears well-developed and well-nourished.  HENT:  Head: Normocephalic and atraumatic.  Eyes: EOM are normal.  Neck: Normal range of motion.  Respiratory: Effort normal.  Musculoskeletal: Normal range of motion.  Neurological: She is oriented to person, place, and time.    Review of Systems  Constitutional: Negative.   HENT: Negative.   Eyes: Negative.   Respiratory: Negative.   Cardiovascular: Negative.   Gastrointestinal: Negative.   Genitourinary: Negative.   Musculoskeletal: Negative.   Skin: Negative.   Neurological: Negative.   Endo/Heme/Allergies: Negative.   Psychiatric/Behavioral: Positive for substance abuse. Negative for suicidal ideas. The patient is  nervous/anxious and has insomnia.     Blood pressure 120/79, pulse (!) 115, temperature 98.3 F (36.8 C), temperature source Oral, resp. rate 16, height 5\' 5"  (1.651 m), weight 59 kg (130 lb), last menstrual period 11/24/2015, SpO2 100 %.Body mass index is 21.63 kg/m.  General Appearance: Fairly Groomed  Eye Contact:  Good  Speech:  Normal Rate  Volume:  Decreased  Mood:  Euthymic  Affect:  Appropriate and Congruent  Thought Process:  Linear and Descriptions of Associations: Intact  Orientation:  Full (Time, Place, and Person)  Thought Content:  Hallucinations: None   Suicidal Thoughts:  No  Homicidal Thoughts:  No  Memory:  Immediate;   Fair Recent;   Fair Remote;   Fair  Judgement:  Fair  Insight:  Fair  Psychomotor Activity:  Decreased  Concentration:  Concentration: Fair and Attention Span: Fair  Recall:  Poor  Fund of Knowledge:  Fair  Language:  Good  Akathisia:  No  Handed:    AIMS (if indicated):     Assets:  Manufacturing systems engineer Physical Health Social Support  ADL's:  Intact  Cognition:  WNL  Sleep:  Number of Hours: 6     Treatment Plan Summary:  Patient is presenting with symptoms of psychosis and mania. She has been given a prior diagnosis of bipolar disorder however not a potential diagnosis could be schizoaffective disorder bipolar type  For bipolar disorder the patient has been started on abilify but the patient has failed to respond to this medication and continues to be labile and disorganized, also displaying paranoia.  Abilify has been discontinued and she has been started on olanzapine I will decrease olanzapine to total dose of 15 mg and she has been tachycardic.  For mood symptoms she will be continued on lithium CR 450 mg po bid   For insomnia: continue ativan 2 mg po qhs.  Pt slept 6 h last night  Tobacco use disorder: Patient declines from receiving nicotine patch  Cannabis use disorder and past history of alcohol use: Patient continues to  be in need of substance abuse treatment. Will refer to outpatient substance abuse upon discharge  Hyperprolactinemia her prolactin is 145 despite her noncompliance with antipsychotics. MRI completed  wnl.  Tachycardia: EKG  Normal sinus rhythm with sinus arrhythmia.Normal ECG. HR 79  UTI: started on amoxacillin 500 mg tid  In need to continue for 2 additional days  Labs hemoglobin A1c, prolactin, lipid panel have been ordered. TSH is within the normal limits. Urine pregnancy neg  Disposition was stable she will be discharged back to her parents  Follow-up to be determined.  Spoke with Father on Friday, Tuesday and Wednesday, spoke with mother also on 8/23. Updated parents on 8/25. Spoke with parents this am  Labs: will order lithium level tomorrow am  Possible discharge  Likely tomorrow  I certify that the services  received since the previous certification/recertification were and continue to be medically necessary as the treatment provided can be reasonably expected to improve the patient's condition; the medical record documents that the services furnished were intensive treatment services or their equivalent services, and this patient continues to need, on a daily basis, active treatment furnished directly by or requiring the supervision of inpatient psychiatric personnel.   Jimmy FootmanHernandez-Gonzalez,  Jeannelle Wiens, MD 01/09/2016, 9:57 AM

## 2016-01-09 NOTE — [facility] Group Notes (Signed)
[facility] Group Notes:  (Nursing/MHT/Case Management/Adjunct)  Date:  01/09/2016  Time:  9:55 PM  Type of Therapy:  Evening Wrap-up Group  Participation Level:  Active  Participation Quality:  Appropriate and Attentive  Affect:  Appropriate  Cognitive:  Alert and Appropriate  Insight:  Appropriate, Good and Improving  Engagement in Group:  Engaged  Modes of Intervention:  Activity  Summary of Progress/Problems:  Jodi MorrowChelsea Nanta [name] 01/09/2016, 9:55 PM

## 2016-01-09 NOTE — Progress Notes (Signed)
Patient states that she slept good last night after requesting for her sleep medication. Denied feeling depressed, hopeless or anxious. Denied SI/HI/AVH and contracted for safety. Patient stated that her goal for the day was to "work out." She said she was going to meet her goal by "just doing it." Patient cooperative, pleasant, attended groups and interacted appropriately with her peers. At about 1pm, while patient was in the med room, she started to talk about incomprehensible stuff, went off on a tangent. She was reoriented to reality. Medication education done. Encouraged to verbalize concerns/thoughts/feelings to nursing staff. She verbalized understanding.

## 2016-01-10 LAB — LITHIUM LEVEL: LITHIUM LVL: 0.75 mmol/L (ref 0.60–1.20)

## 2016-01-10 NOTE — Progress Notes (Signed)
Patient's discharge instructions, teachings, medications and follow up appointments discussed with the patient. She verbalized understanding. Property in her locker given back and had no concerns. Patient was picked up at the [facility] by her father. Medications, prescriptions and follow up appointments discussed with her father. They verbalized understanding. No acute distress noted at the time of discharge.

## 2016-01-10 NOTE — Tx Team (Signed)
Interdisciplinary Treatment Plan Update (Adult)         Date: 01/10/2016   Time Reviewed: 10:30 AM   Progress in Treatment: Improving Attending groups: Yes  Participating in groups: Yes  Taking medication as prescribed: Yes  Tolerating medication: Yes  Family/Significant other contact made: CSW spoke with dad, Norberto Sorenson Patient understands diagnosis: Yes  Discussing patient identified problems/goals with staff: Yes  Medical problems stabilized or resolved: Yes  Denies suicidal/homicidal ideation: Yes  Issues/concerns per patient self-inventory: Yes  Other:   New problem(s) identified: N/A   Discharge Plan or Barriers: see below   Reason for Continuation of Hospitalization:   Depression   Anxiety   Medication Stabilization   Comments: N/A   Estimated length of stay: 5 days    Patient is a 25 year old female admitted for psychosis. Patient lives in [place], [place].  25 year old woman brought in to the emergency room presumably by her family because of worsening psychosis. Most of the history obtained from the patient. She says that for some period of time probably more than a week her mood has been very bad. She has a hard time describing it but it sounds like it's been a combination of depressed and angry. She says she hasn't slept in a long time probably over a week. Her appetite has been poor. She describes having auditory hallucinations saying that there are voices telling her to kill her self. All of this makes her history sound more organized than it is. The patient is very disorganized in her thinking  Patient will benefit from crisis stabilization, medication evaluation, group therapy, and psycho education in addition to case management for discharge planning. Patient and CSW reviewed pt's identified goals and treatment plan. Pt verbalized understanding and agreed to treatment plan.    Review of initial/current patient goals per problem list:  1. Goal(s): Patient will  participate in aftercare plan   Met: Yes   Target date: 3-5 days post admission date   As evidenced by: Patient will participate within aftercare plan AEB aftercare provider and housing plan at discharge being identified.   Patient will follow-up with [facility].   2. Goal (s): Patient will exhibit decreased depressive symptoms and suicidal ideations.   Met: Yes  Target date: 3-5 days post admission date   As evidenced by: Patient will utilize self-rating of depression at 3 or below and demonstrate decreased signs of depression or be deemed stable for discharge by MD.   Patient reports a depression score of 2 at this time. Denies SI at this time.  3. Goal(s): Patient will demonstrate decreased signs and symptoms of anxiety.   Met: Yes  Target date: 3-5 days post admission date   As evidenced by: Patient will utilize self-rating of anxiety at 3 or below and demonstrated decreased signs of anxiety, or be deemed stable for discharge by MD   Reports an anxiety score of 3 at this time.    5. Goal(s): Patient will demonstrate decreased signs of psychosis  * Met: Yes * Target date: 3-5 days post admission date  * As evidenced by: Patient will demonstrate decreased frequency of AVH or return to baseline function   Patient denies AVH at this time. States that she sis much better. Adequate for discharge    Patient: [facility] Family:  Physician: Merlyn Albert, MD    01/10/2016 10:30AM  Nursing: Floyde Parkins, RN     01/10/2016 10:30AM  Clinical Social Worker: Emilie Rutter, [place]  01/10/2016  10:30AM  Other: Everitt Amber, Recreational Therapist  01/10/2016 10:30AM

## 2016-01-10 NOTE — Progress Notes (Signed)
Recreation Therapy Notes  Date: 08.28.17 Time: 10:10 am Location: Craft Room  Group Topic: Self-expression  Goal Area(s) Addresses:  Patient will be able to identify a color that represents each emotion. Patient will verbalize benefit of using art as a means of self-expression. Patient will verbalize one emotion experienced while participating in activity.  Behavioral Response: Arrived late, Attentive  Intervention: The Colors Within Me  Activity: Patients were given blank face worksheets and instructed to pick a color for each emotion they were feeling and show on the face how much of that emotion they were feeling.  Education: LRT educated patients on other forms of self-expression.  Education Outcome: Acknowledges education/In group clarification offered   Clinical Observations/Feedback: Patient arrived to group at approximately 10:25 am. LRT explained activity. Patient picked colors for each emotion she was feeling. Patient did not contribute to group discussion.  Jacquelynn CreeGreene,Skiler Olden M, LRT/CTRS 01/10/2016 10:51 AM

## 2016-01-10 NOTE — Progress Notes (Signed)
  [facility]  Will you be returning to the same living situation after discharge:  Yes,  home with family. At discharge, do you have transportation home?: Yes,  father will pick pt up. Do you have the ability to pay for your medications: No. referred to medication management clinic  Release of information consent forms completed and in the chart;  Patient's signature needed at discharge.  Patient to Follow up at: Follow-up Information    [facility]. Go on 01/11/2016.   Why:  Please arrive to the walk-in clinic between the hours of 9am-2:30pm for your hospital follow-up appointment. Please bring discharge paperwork. Arrive as early as possible for prompt service.  Contact information: [address], [place], [place] [address] Phone: [phone] Fax: [phone]          Next level of care provider [facility]:no  Safety Planning and Suicide Prevention discussed: Yes,  SPE completed with pt and father  Have you used any form of tobacco in the last 30 days? (Cigarettes, Smokeless Tobacco, Cigars, and/or Pipes): No  Has patient been referred to the Quitline?: N/A patient is not a smoker  Patient has been referred for addiction treatment: Yes - [facility].  Lynden OxfordKadijah R Saachi Zale, MSW, LCSW-A 01/10/2016, 9:33 AM

## 2016-09-11 ENCOUNTER — Telehealth: Payer: Self-pay | Admitting: Obstetrics & Gynecology

## 2016-09-11 NOTE — Telephone Encounter (Signed)
Presciption not authorized. Pt needs an appointment for annual. Dr. Luella Cook no longer with [facility]

## 2016-10-06 ENCOUNTER — Other Ambulatory Visit: Payer: Self-pay

## 2016-10-06 MED ORDER — NORGESTIM-ETH ESTRAD TRIPHASIC 0.18/0.215/0.25 MG-35 MCG PO TABS: 1.0000 | ORAL_TABLET | Freq: Every day | ORAL | 0 refills | Status: DC

## 2016-10-06 NOTE — Telephone Encounter (Signed)
Fax rx reqest from [facility] for pt's tri-previfem. Previous [facility] pt last seen 08/06/15. appt with CLG 11/07/16. Refilled one time only.

## 2016-11-07 ENCOUNTER — Ambulatory Visit: Payer: Self-pay | Admitting: Certified Nurse Midwife

## 2016-12-21 ENCOUNTER — Other Ambulatory Visit: Payer: Self-pay

## 2016-12-21 MED ORDER — NORGESTIM-ETH ESTRAD TRIPHASIC 0.18/0.215/0.25 MG-35 MCG PO TABS: 1.0000 | ORAL_TABLET | Freq: Every day | ORAL | 0 refills | Status: DC

## 2017-01-03 IMAGING — MR MR HEAD WO/W CM
14 of 18 series · 44 of 48 positions shown · IV contrast (6mL Multihance)
Comparison: None.

CLINICAL DATA: 25-year-old female with hyperprolactinemia. Initial
encounter.

EXAM:
MRI HEAD WITHOUT AND WITH CONTRAST
TECHNIQUE: Multiplanar, multiecho pulse sequences of the brain and surrounding
structures were obtained without and with intravenous contrast.
CONTRAST:  6mL MULTIHANCE GADOBENATE DIMEGLUMINE 529 MG/ML IV SOLN

[Series 4: DWI · axial · 3.0mm · 1.80mm/px · z∈[-35,+124]mm · 6 of 41 slices shown (1 of 2)]
[im 1/41]
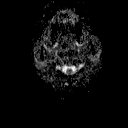
[im 9/41]
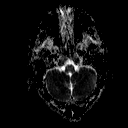
[im 17/41]
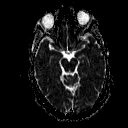
[im 25/41]
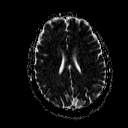
[im 33/41]
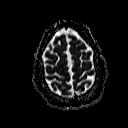
[im 41/41]
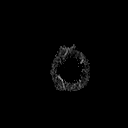

[Series 5: T1 · sagittal · 5.0mm · 0.45mm/px · 3 of 25 slices shown (1 of 5)]
[im 1/25]
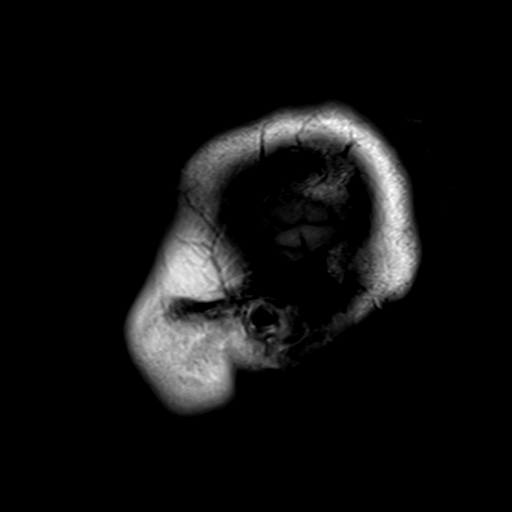
[im 13/25]
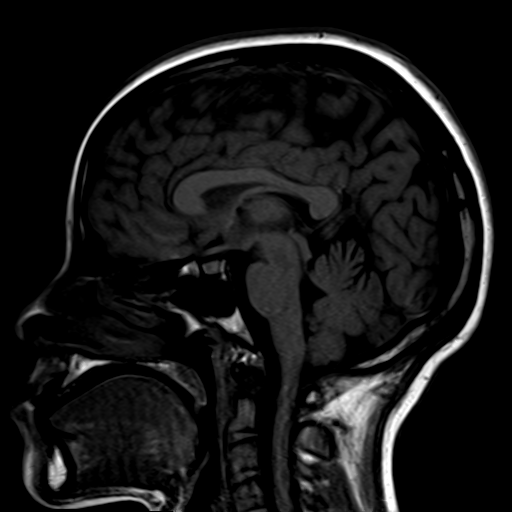
[im 25/25]
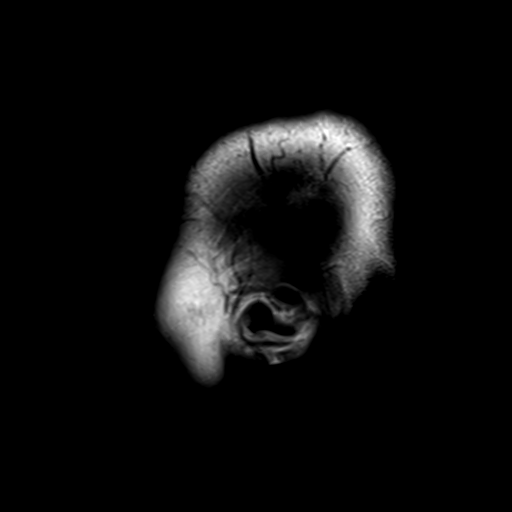

[Series 6: T2 · axial · 5.0mm · 0.60mm/px · z∈[-30,+125]mm · 3 of 25 slices shown (1 of 2)]
[im 1/25]
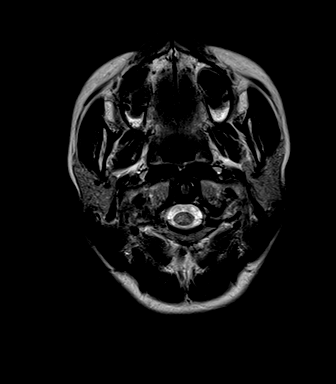
[im 13/25]
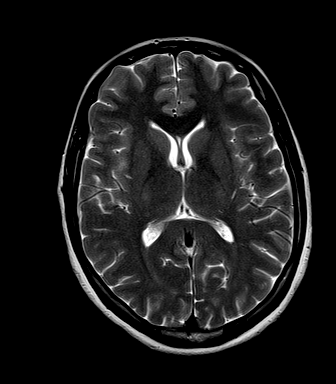
[im 25/25]
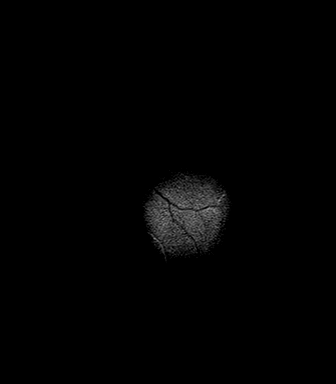

[Series 7: FLAIR · axial · 5.0mm · 0.45mm/px · z∈[-30,+125]mm · 3 of 25 slices shown]
[im 1/25]
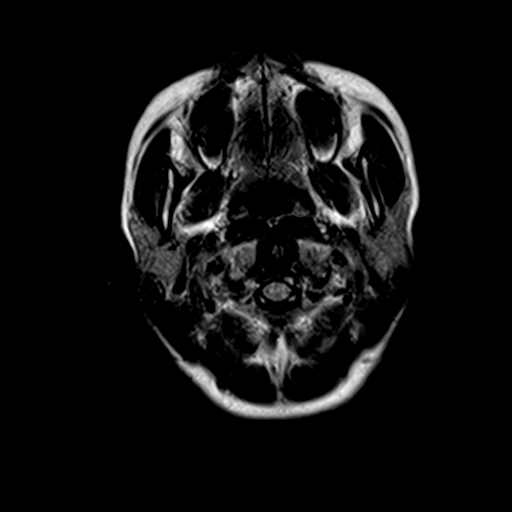
[im 13/25]
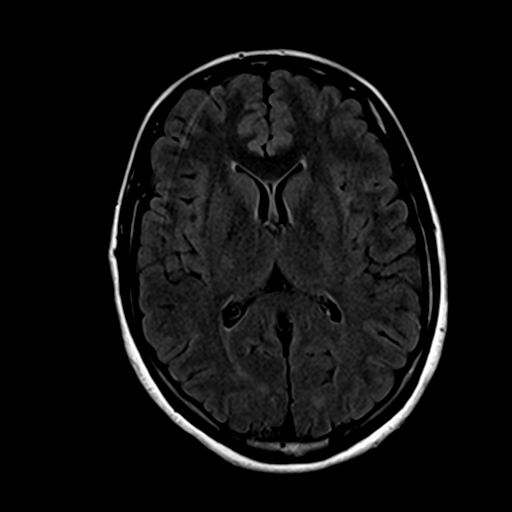
[im 25/25]
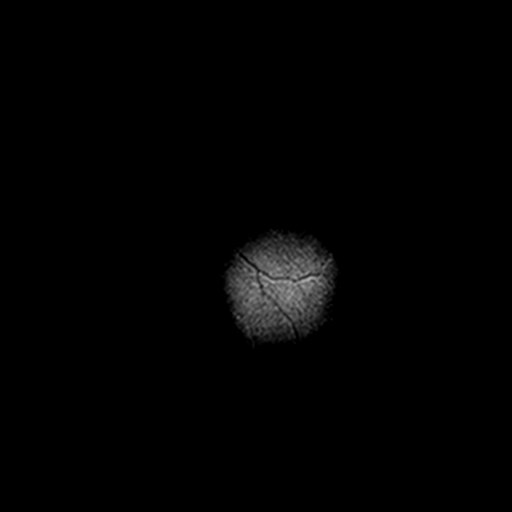

[Series 8: T2 · axial · 5.0mm · 0.45mm/px · z∈[-30,+125]mm · 3 of 25 slices shown (2 of 2)]
[im 1/25]
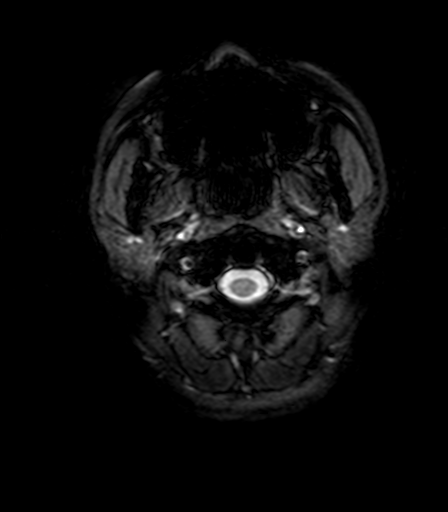
[im 13/25]
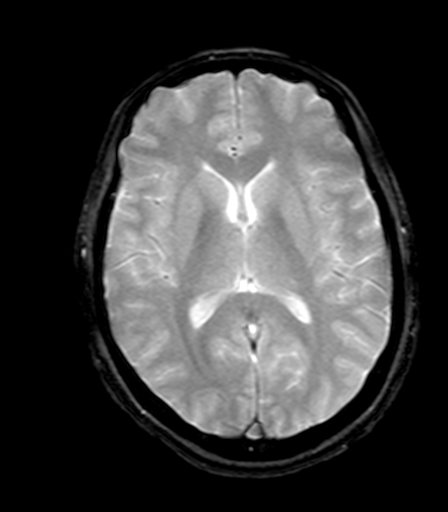
[im 25/25]
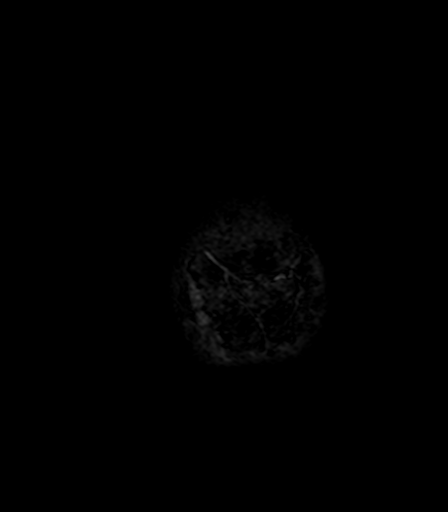

[Series 9: T1 · sagittal · 3.0mm · 0.42mm/px · 2 of 15 slices shown (2 of 5)]
[im 1/15]
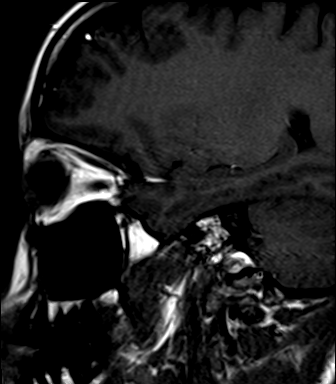
[im 15/15]
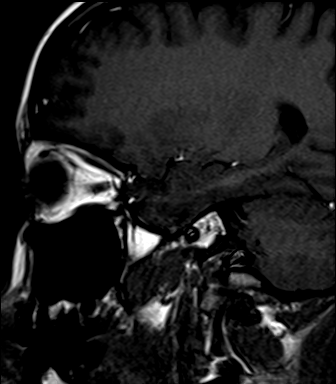

[Series 10: T1 · coronal · 3.0mm · 0.83mm/px · 2 of 15 slices shown (3 of 5)]
[im 1/15]
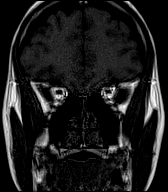
[im 15/15]
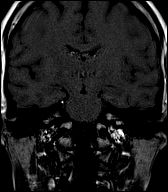

[Series 11: t1_tse_cor_dynamic pit · coronal · 3.0mm · 0.42mm/px · 1 of 9 slices shown]
[im 1/9]
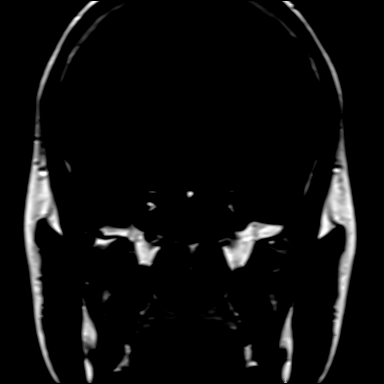

[Series 12: ti cor dynamic · coronal · 3.0mm · 0.42mm/px · 1 of 9 slices shown]
[im 1/9]
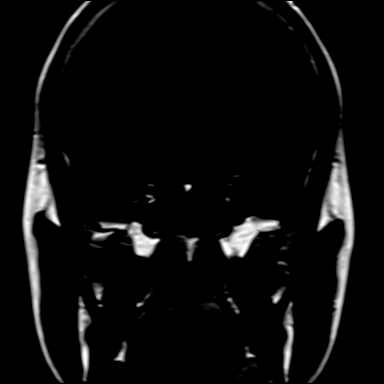

[Series 17: T1 · sagittal · 3.0mm · 0.42mm/px · 2 of 15 slices shown (4 of 5)]
[im 1/15]
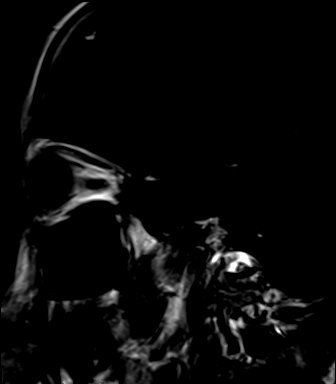
[im 15/15]
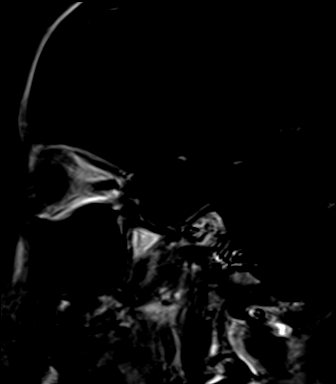

[Series 18: T1 · coronal · 3.0mm · 0.83mm/px · 2 of 15 slices shown (5 of 5)]
[im 1/15]
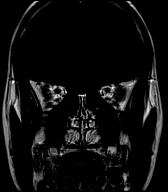
[im 15/15]
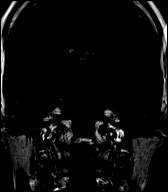

[Series 19: T1 post-contrast · axial · 3.0mm · 1.00mm/px · z∈[-38,+137]mm · 8 of 60 slices shown (1 of 2)]
[im 1/60]
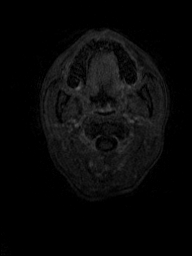
[im 9/60]
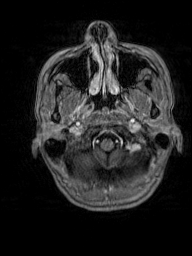
[im 17/60]
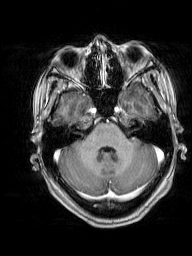
[im 26/60]
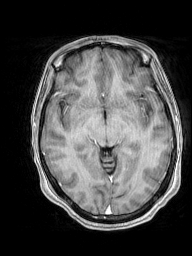
[im 34/60]
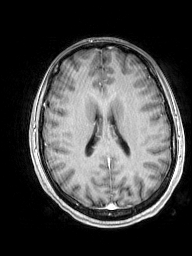
[im 43/60]
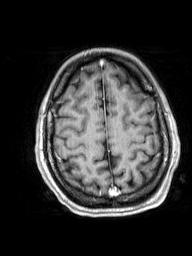
[im 51/60]
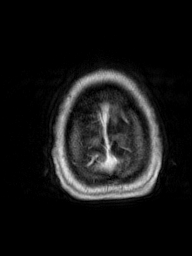
[im 60/60]
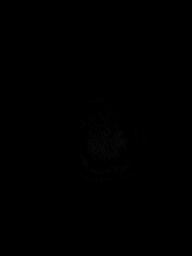

[Series 20: T1 post-contrast · coronal · 5.0mm · 0.43mm/px · 3 of 27 slices shown (2 of 2)]
[im 1/27]
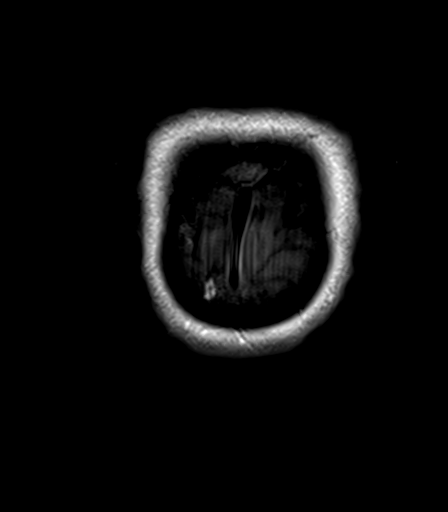
[im 14/27]
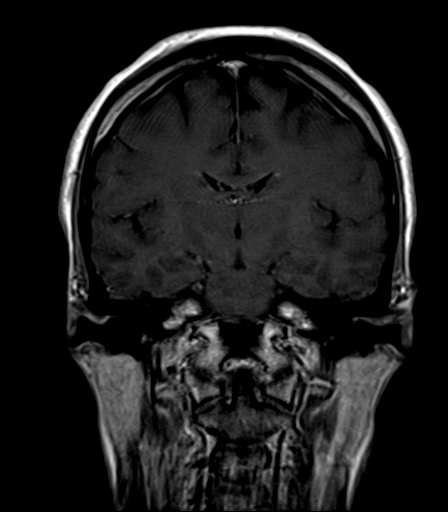
[im 27/27]
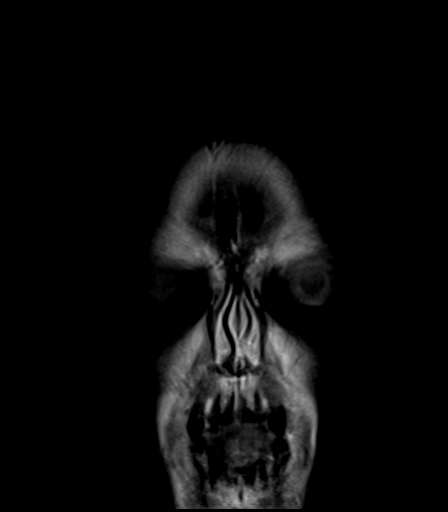

[Series 100: DWI · axial · 3.0mm · 1.80mm/px · z∈[-35,+124]mm · 5 of 42 slices shown (2 of 2)]
[im 1/42]
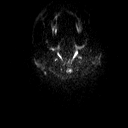
[im 11/42]
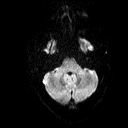
[im 21/42]
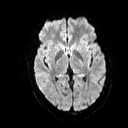
[im 31/42]
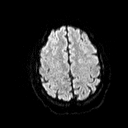
[im 42/42]
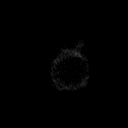

[44 of 48 positions shown; findings below may reference images not displayed]

FINDINGS: Cerebral volume is within normal limits. No restricted diffusion to
suggest acute infarction. No midline shift, mass effect, evidence of
mass lesion, ventriculomegaly, extra-axial collection or acute
intracranial hemorrhage. Cervicomedullary junction within normal
limits. Negative visualized cervical spine. Major intracranial
vascular flow voids are preserved. Gray and white matter signal is
within normal limits throughout the brain. Excluding the pituitary
region, no abnormal intracranial enhancement.

Visible internal auditory structures appear normal. Visualized
paranasal sinuses and mastoids are stable and well pneumatized.
Negative orbit and scalp soft tissues. Normal bone marrow signal.

Dedicated pituitary imaging. Normal overall pituitary size and
configuration. No suprasellar mass or mass effect. The infundibulum
appears normal. Hypothalamus is normal. Cavernous sinuses are
normal. Dynamic and delayed post-contrast images of the pituitary
gland are mildly degraded by motion but demonstrate no abnormal
pituitary enhancement or discrete pituitary lesion.
IMPRESSION: Normal MRI appearance of the pituitary. Normal MRI appearance of the
brain.

## 2017-01-21 NOTE — Progress Notes (Signed)
Gynecology Annual Exam  PCP: Patient, No Pcp Per  Chief Complaint:  Chief Complaint  Patient presents with  . Gynecologic Exam    History of Present Illness:Jodi Stephens is a 26 year old African American/Black female, G0 P0000, who presents for her annual exam. She is having no significant GYN problems.  Her menses are regular and her LMP was 01/17/2017. They occur every 28 days, they last 5 days, are medium flow, and are without clots.  She has had no spotting.  She reports mild dysmenorrhea not requiring medication.  The patient's past medical history is remarkable for schizoaffective disorder, bipolar type, not currently on medication. Since her last annual GYN exam dated 08/06/2015, she was hospitalized for a psychotic episode 12/2015. She is not currently on any medication. 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 She is sexually active. She has been with her current partner x 2 years. Declines STD testing. Her most recent pap smear was obtained 3/24/2017and was NIL/negative HRHPV.  Mammogram is not applicable.  There is a family history of breast cancer in her maternal aunt, Genetic testing has not been done There is no family history of ovarian cancer.  The patient does not do monthly self breast exams.  The patient does not smoke. Former smoker The patient does drink alcohol once a month.  The patient does not use illegal drugs. Has used MJ in the past The patient exercises regularly doing [employer] 3x/week. The patient does get adequate calcium in her diet. She is vegatarian She had a recent cholesterol screen in 2016 that was normal.   Review of Systems: Review of Systems  Constitutional: Negative for chills, fever and weight loss.  HENT: Negative for congestion, sinus pain and sore throat.   Eyes: Negative for blurred vision and pain.  Respiratory: Negative for hemoptysis, shortness of breath and wheezing.   Cardiovascular: Negative  for chest pain, palpitations and leg swelling.  Gastrointestinal: Negative for abdominal pain, blood in stool, diarrhea, heartburn, nausea and vomiting.  Genitourinary: Negative for dysuria, frequency, hematuria and urgency.  Musculoskeletal: Negative for back pain, joint pain and myalgias.  Skin: Negative for itching and rash.  Neurological: Negative for dizziness, tingling and headaches.  Endo/Heme/Allergies: Negative for environmental allergies and polydipsia. Does not bruise/bleed easily.       Negative for hirsutism   Psychiatric/Behavioral: Negative for depression. The patient is not nervous/anxious and does not have insomnia.     Past Medical History:  Past Medical History:  Diagnosis Date  . History of substance abuse   . Schizoaffective disorder, bipolar type [facility])     Past Surgical History:  Past Surgical History:  Procedure Laterality Date  . WISDOM TOOTH EXTRACTION      Family History:  Family History  Problem Relation Age of Onset  . Hyperlipidemia Mother   . Hypertension Mother   . Brain cancer Paternal Grandmother   . Breast cancer Maternal Aunt 60  . Gout Father   . Diverticulosis Father   . Stroke Maternal Uncle     Social History:  Social History   Social History  . Marital status: Single    Spouse name: N/A  . Number of children: N/A  . Years of education: N/A   Occupational History  . Not on file.   Social History Main Topics  . Smoking status: Former Games developer  . Smokeless tobacco: Never Used  . Alcohol use Yes  . Drug use:  No     Comment: Former MJ use  . Sexual activity: Yes    Partners: Male    Birth control/ protection: Pill   Other Topics Concern  . Not on file   Social History Narrative  . No narrative on file    Allergies:  No Known Allergies  Medications:  Current Outpatient Prescriptions:  .  Norgestimate-Ethinyl Estradiol Triphasic (TRI-PREVIFEM) 0.18/0.215/0.25 MG-35 MCG tablet, Take 1 tablet by mouth daily., Disp: 1  Package, Rfl: 11 Physical Exam Vitals: BP 108/68   Pulse 80   Ht 5\' 4"  (1.626 m)   Wt 155 lb (70.3 kg)   LMP 01/17/2017 (Exact Date)   BMI 26.61 kg/m   General: BF in  NAD HEENT: normocephalic, anicteric Neck: no thyroid enlargement, no palpable nodules, no cervical lymphadenopathy  Pulmonary: No increased work of breathing, CTAB Cardiovascular: RRR, without murmur  Breast: Breast symmetrical, no tenderness, no palpable nodules or masses, no skin or nipple retraction present, no nipple discharge.  No axillary, infraclavicular or supraclavicular lymphadenopathy. Abdomen: Soft, non-tender, non-distended.  Umbilicus without lesions.  No hepatomegaly or masses palpable. No evidence of hernia. Genitourinary:  External: Normal external female genitalia.  Normal urethral meatus, normal Bartholin's and Skene's glands.    Vagina: Normal vaginal mucosa, no evidence of prolapse.    Cervix: Grossly normal in appearance, no bleeding, non-tender  Uterus: Anteverted, normal size, shape, and consistency, mobile, and non-tender  Adnexa: No adnexal masses, non-tender  Rectal: deferred  Lymphatic: no evidence of inguinal lymphadenopathy Extremities: no edema, erythema, or tenderness Neurologic: Grossly intact Psychiatric: mood appropriate, affect full     Assessment: 26 y.o. annual gyn exam  Plan:   1) Breast cancer screening - recommend monthly self breast exam. Reviewed how to do monthly SBEs  2) STI screening was offered and declined.  3) Cervical cancer screening - Pap was done. ASCCP guidelines and rational discussed.  Patient opts for yearly screening interval  4) Contraception - given refill of Tri previfem x 1 year  5) Routine healthcare maintenance including cholesterol and diabetes screening UTD   Farrel Connersolleen Fredrika Canby, CNM

## 2017-01-22 ENCOUNTER — Ambulatory Visit (INDEPENDENT_AMBULATORY_CARE_PROVIDER_SITE_OTHER): Payer: 59 | Admitting: Certified Nurse Midwife

## 2017-01-22 ENCOUNTER — Encounter: Payer: Self-pay | Admitting: Certified Nurse Midwife

## 2017-01-22 VITALS — BP 108/68 | HR 80 | Ht 64.0 in | Wt 155.0 lb

## 2017-01-22 DIAGNOSIS — Z3041 Encounter for surveillance of contraceptive pills: Secondary | ICD-10-CM

## 2017-01-22 DIAGNOSIS — Z01419 Encounter for gynecological examination (general) (routine) without abnormal findings: Secondary | ICD-10-CM | POA: Diagnosis not present

## 2017-01-22 DIAGNOSIS — Z124 Encounter for screening for malignant neoplasm of cervix: Secondary | ICD-10-CM | POA: Diagnosis not present

## 2017-01-22 MED ORDER — NORGESTIM-ETH ESTRAD TRIPHASIC 0.18/0.215/0.25 MG-35 MCG PO TABS: 1.0000 | ORAL_TABLET | Freq: Every day | ORAL | 11 refills | Status: DC

## 2017-01-24 LAB — IGP,RFX APTIMA HPV ALL PTH: PAP SMEAR COMMENT: 0

## 2017-01-29 ENCOUNTER — Encounter: Payer: Self-pay | Admitting: Certified Nurse Midwife

## 2017-12-29 ENCOUNTER — Other Ambulatory Visit: Payer: Self-pay | Admitting: Certified Nurse Midwife

## 2018-03-18 ENCOUNTER — Telehealth: Payer: Self-pay | Admitting: Certified Nurse Midwife

## 2018-03-18 NOTE — Telephone Encounter (Signed)
Patient is schedule Friday 05/17/17 at 10 am with CLG

## 2018-03-18 NOTE — Telephone Encounter (Signed)
Please call pt and schedule annual exam.  Thanks.

## 2018-05-17 ENCOUNTER — Ambulatory Visit (INDEPENDENT_AMBULATORY_CARE_PROVIDER_SITE_OTHER): Payer: Self-pay | Admitting: Certified Nurse Midwife

## 2018-05-17 ENCOUNTER — Encounter: Payer: Self-pay | Admitting: Certified Nurse Midwife

## 2018-05-17 ENCOUNTER — Other Ambulatory Visit (HOSPITAL_COMMUNITY)
Admission: RE | Admit: 2018-05-17 | Discharge: 2018-05-17 | Disposition: A | Discharge status: Home / Self Care | Payer: Self-pay | Source: Ambulatory Visit | Attending: Certified Nurse Midwife | Admitting: Certified Nurse Midwife

## 2018-05-17 VITALS — BP 100/66 | HR 74 | Ht 65.0 in | Wt 168.0 lb

## 2018-05-17 DIAGNOSIS — Z01419 Encounter for gynecological examination (general) (routine) without abnormal findings: Secondary | ICD-10-CM

## 2018-05-17 DIAGNOSIS — L292 Pruritus vulvae: Secondary | ICD-10-CM

## 2018-05-17 DIAGNOSIS — Z124 Encounter for screening for malignant neoplasm of cervix: Secondary | ICD-10-CM | POA: Insufficient documentation

## 2018-05-17 DIAGNOSIS — N926 Irregular menstruation, unspecified: Secondary | ICD-10-CM

## 2018-05-17 DIAGNOSIS — B373 Candidiasis of vulva and vagina: Secondary | ICD-10-CM

## 2018-05-17 DIAGNOSIS — Z3202 Encounter for pregnancy test, result negative: Secondary | ICD-10-CM

## 2018-05-17 DIAGNOSIS — Z3041 Encounter for surveillance of contraceptive pills: Secondary | ICD-10-CM

## 2018-05-17 DIAGNOSIS — B3731 Acute candidiasis of vulva and vagina: Secondary | ICD-10-CM

## 2018-05-17 DIAGNOSIS — N9089 Other specified noninflammatory disorders of vulva and perineum: Secondary | ICD-10-CM

## 2018-05-17 LAB — POCT WET PREP (WET MOUNT): Trichomonas Wet Prep HPF POC: ABSENT

## 2018-05-17 LAB — POCT URINE PREGNANCY: PREG TEST UR: NEGATIVE

## 2018-05-17 LAB — HM PAP SMEAR: HM Pap smear: NEGATIVE

## 2018-05-17 MED ORDER — NORGESTIM-ETH ESTRAD TRIPHASIC 0.18/0.215/0.25 MG-35 MCG PO TABS: ORAL_TABLET | ORAL | 3 refills | Status: DC

## 2018-05-17 MED ORDER — TERCONAZOLE 0.8 % VA CREA: TOPICAL_CREAM | VAGINAL | 0 refills | Status: DC

## 2018-05-17 NOTE — Progress Notes (Signed)
Gynecology Annual Exam  PCP: Patient, No Pcp Per  Chief Complaint:  Chief Complaint  Patient presents with  . Gynecologic Exam    discuss birthcontrol/unsure LMP requesting UPT today    History of Present Illness:Jodi Stephens is a 28 year old African American/Black female, G0 P0000, who presents for her annual exam. She is having no significant GYN problems. Ran out of OCPs a couple of months ago and has not been using contraception, but also broke up with boyfriend.  [facility] and her LMP was ?early December. They occur every 28 days, they last 4-7 days, and are medium flow. She has had no intermenstrual spotting.  She denies dysmenorrhea. The patient's past medical history is remarkable for schizoaffective disorder, bipolar type, not currently on medication. Since her last annual GYN exam dated 01/22/2017, she was hospitalized for a pschiatric episode. Has a hx of being admitted for a psychotic episode 12/2015. She is currently taking olanzapine. She is employed by [employer]. She is sexually active.  Declines STD testing. Her most recent pap smear was obtained 01/22/2017 and was NIL.   Mammogram is not applicable.  There is a family history of breast cancer in her maternal aunt, Genetic testing has not been done There is no family history of ovarian cancer.  The patient does not do monthly self breast exams.  The patient does not smoke. Former smoker The patient does not drink alcohol.  The patient does not use illegal drugs. Has used MJ in the past The patient has been exercising regularly doing [employer] 3x/week. The patient does get adequate calcium in her diet. She is pescatarian She had a recent cholesterol screen in 2016 that was normal.   Review of Systems: Review of Systems  Constitutional: Negative for chills, fever and weight loss.       Positive for 13# weight gain  HENT: Negative for congestion, sinus pain and sore throat.   Eyes: Negative  for blurred vision and pain.  Respiratory: Negative for hemoptysis, shortness of breath and wheezing.   Cardiovascular: Negative for chest pain, palpitations and leg swelling.  Gastrointestinal: Negative for abdominal pain, blood in stool, diarrhea, heartburn, nausea and vomiting.  Genitourinary: Negative for dysuria, frequency, hematuria and urgency.  Musculoskeletal: Negative for back pain, joint pain and myalgias.  Skin: Negative for itching and rash.  Neurological: Negative for dizziness, tingling and headaches.  Endo/Heme/Allergies: Negative for environmental allergies and polydipsia. Does not bruise/bleed easily.       Negative for hirsutism   Psychiatric/Behavioral: Negative for depression. The patient is not nervous/anxious and does not have insomnia.     Past Medical History:  Past Medical History:  Diagnosis Date  . History of substance abuse (HCC)   . Schizoaffective disorder, bipolar type [facility](HCC)     Past Surgical History:  Past Surgical History:  Procedure Laterality Date  . WISDOM TOOTH EXTRACTION      Family History:  Family History  Problem Relation Age of Onset  . Hyperlipidemia Mother   . Hypertension Mother   . Brain cancer Paternal Grandmother   . Breast cancer Maternal Aunt 60  . Gout Father   . Diverticulosis Father   . Stroke Maternal Uncle     Social History:  Social History   Socioeconomic History  . Marital status: Single    Spouse name: Not on file  . Number of children: Not on file  . Years of education: Not on file  .  Highest education level: Not on file  Occupational History  . Not on file  Social Needs  . Financial resource strain: Not on file  . Food insecurity:    Worry: Not on file    Inability: Not on file  . Transportation needs:    Medical: Not on file    Non-medical: Not on file  Tobacco Use  . Smoking status: Former Games developer  . Smokeless tobacco: Never Used  Substance and Sexual Activity  . Alcohol use: Not Currently  .  Drug use: Not Currently    Comment: Former MJ use  . Sexual activity: Yes    Partners: Male    Birth control/protection: Pill  Lifestyle  . Physical activity:    Days per week: Not on file    Minutes per session: Not on file  . Stress: Not on file  Relationships  . Social connections:    Talks on phone: Not on file    Gets together: Not on file    Attends religious service: Not on file    Active member of club or organization: Not on file    Attends meetings of clubs or organizations: Not on file    Relationship status: Not on file  . Intimate partner violence:    Fear of current or ex partner: Not on file    Emotionally abused: Not on file    Physically abused: Not on file    Forced sexual activity: Not on file  Other Topics Concern  . Not on file  Social History Narrative  . Not on file    Allergies:  No Known Allergies  Medications:  Current Outpatient Medications:  .  OLANZapine (ZYPREXA) 15 MG tablet, Take 15 mg by mouth at bedtime., Disp: , Rfl: 0 .  Norgestimate-Ethinyl Estradiol Triphasic (TRI-PREVIFEM) 0.18/0.215/0.25 MG-35 MCG tablet, TAKE 1 TABLET BY MOUTH EVERY DAY., Disp: 84 tablet, Rfl: 3 .  terconazole (TERAZOL 3) 0.8 % vaginal cream, Insert one applicator full qhs x 3 nights. Can apply externally BID prn, Disp: 20 g, Rfl: 0 Physical Exam Vitals: BP 100/66 (BP Location: Left Arm, Patient Position: Sitting, Cuff Size: Normal)   Pulse 74   Ht 5\' 5"  (1.651 m)   Wt 168 lb (76.2 kg)   LMP 04/17/2018 (Approximate)   BMI 27.96 kg/m   General: BF in  NAD HEENT: normocephalic, anicteric Neck: no thyroid enlargement, no palpable nodules, no cervical lymphadenopathy  Pulmonary: No increased work of breathing, CTAB Cardiovascular: RRR, without murmur  Breast: right breast 1/2 cup size> left, no tenderness, no palpable nodules or masses, no skin or nipple retraction present, no nipple discharge.  No axillary, infraclavicular or supraclavicular  lymphadenopathy. Abdomen: Soft, non-tender, non-distended.  Umbilicus without lesions.  No hepatomegaly or masses palpable. No evidence of hernia. Genitourinary:  External: Inflammation/ irritation at introitus  Normal urethral meatus, normal Bartholin's and Skene's glands.    Vagina: Normal vaginal mucosa, no evidence of prolapse, white creamy discharge    Cervix: Grossly normal in appearance, friable with Pap, non-tender  Uterus: Anteflexed, normal size, shape, and consistency, mobile, and non-tender  Adnexa: No adnexal masses, non-tender  Rectal: deferred  Lymphatic: no evidence of inguinal lymphadenopathy Extremities: no edema, erythema, or tenderness Neurologic: Grossly intact Psychiatric: mood appropriate, affect full Results for orders placed or performed in visit on 05/17/18 (from the past 24 hour(s))  POCT urine pregnancy     Status: Normal   Collection Time: 05/17/18 10:12 AM  Result Value Ref Range  Preg Test, Ur Negative Negative  POCT Wet Prep Mellody Drown(Wet [place])     Status: Abnormal   Collection Time: 05/17/18  1:29 PM  Result Value Ref Range   Source Wet Prep POC vagina    WBC, Wet Prep HPF POC     Bacteria Wet Prep HPF POC     BACTERIA WET PREP MORPHOLOGY POC     Clue Cells Wet Prep HPF POC None None   Clue Cells Wet Prep Whiff POC     Yeast Wet Prep HPF POC Many (A) None   KOH Wet Prep POC     Trichomonas Wet Prep HPF POC Absent Absent      Assessment: 28 y.o. annual gyn exam Monilial vulvovaginitis  Plan:   1) Breast cancer screening - recommend monthly self breast exam.  2) STI screening was offered and declined.  3) Cervical cancer screening - Pap was done. ASCCP guidelines and rational discussed.  Patient opts for yearly screening interval  4) Contraception - given refill of Tri previfem x 1 year. Can restart on first day of bleeding or on Sunday after menses begins. Use back up x 1 week.   5) Routine healthcare maintenance including cholesterol and  diabetes screening UTD   6) Terazol 3 cream- insert one applicatorful qhs x 3. Apply externally BID prn  7) RTO 1 year and prn.  Farrel Connersolleen Mireya Meditz, CNM

## 2018-05-20 LAB — CYTOLOGY - PAP: DIAGNOSIS: NEGATIVE

## 2019-05-19 NOTE — Progress Notes (Signed)
Gynecology Annual Exam  PCP: Patient, No Pcp Per  Chief Complaint:  Chief Complaint  Patient presents with  . Annual Exam    History of Present Illness:Jodi Stephens is a 29 year old African American/Black female, G0 P0000, who presents for her annual exam. She is having no significant GYN problems. Her menses are regular on the [facility] and her LMP was 01 May 2019. They occur every 28 days, they last 5 days, and are medium flow. She has had no intermenstrual spotting.  She occasionally has dysmenorrhea on the first day of her menses, not requiring analgesics. The patient's past medical history is remarkable for schizoaffective disorder, bipolar type. Since her last annual GYN exam dated 06/13/2018, she has had no changes in her health.  Has a hx of being admitted for a psychotic episode 12/2015. She is currently taking Latuda (was changed from [place]). She is employed by [employer]. She is not currently sexually active.  Declines STD testing. Her most recent pap smear was obtained 06/13/2018 and was NIL.   Mammogram is not applicable.  There is a family history of breast cancer in her maternal aunt, Genetic testing has not been done There is no family history of ovarian cancer.  The patient does not do monthly self breast exams.  The patient does not smoke. Former smoker The patient does not drink alcohol.  The patient does not use illegal drugs. Has used MJ in the past The patient has been back exercising regularly doing [employer] 3x/week. (Took a break during the pandemic when gyms were closed) The patient does get adequate calcium in her diet. She is pescatarian She had a recent cholesterol screen in 2019 that was normal.   Review of Systems: Review of Systems  Constitutional: Negative for chills, fever and weight loss.  HENT: Negative for congestion, sinus pain and sore throat.   Eyes: Negative for blurred vision and pain.  Respiratory: Negative for hemoptysis, shortness  of breath and wheezing.   Cardiovascular: Negative for chest pain, palpitations and leg swelling.  Gastrointestinal: Negative for abdominal pain, blood in stool, diarrhea, heartburn, nausea and vomiting.  Genitourinary: Negative for dysuria, frequency, hematuria and urgency.  Musculoskeletal: Negative for back pain, joint pain and myalgias.  Skin: Negative for itching and rash.  Neurological: Negative for dizziness, tingling and headaches.  Endo/Heme/Allergies: Negative for environmental allergies and polydipsia. Does not bruise/bleed easily.       Negative for hirsutism   Psychiatric/Behavioral: Negative for depression. The patient is not nervous/anxious and does not have insomnia.     Past Medical History:  Past Medical History:  Diagnosis Date  . History of substance abuse (HCC)   . Schizoaffective disorder, bipolar type [facility])     Past Surgical History:  Past Surgical History:  Procedure Laterality Date  . WISDOM TOOTH EXTRACTION      Family History:  Family History  Problem Relation Age of Onset  . Hyperlipidemia Mother   . Hypertension Mother   . Brain cancer Paternal Grandmother   . Breast cancer Maternal Aunt 60  . Gout Father   . Diverticulosis Father   . Stroke Maternal Uncle     Social History:  Social History   Socioeconomic History  . Marital status: Single    Spouse name: Not on file  . Number of children: Not on file  . Years of education: Not on file  . Highest education level: Not on file  Occupational History  . Not on  file  Tobacco Use  . Smoking status: Former Research scientist (life sciences)  . Smokeless tobacco: Never Used  Substance and Sexual Activity  . Alcohol use: Not Currently  . Drug use: Not Currently    Comment: Former MJ use  . Sexual activity: Yes    Partners: Male    Birth control/protection: Pill  Other Topics Concern  . Not on file  Social History Narrative  . Not on file   Social Determinants of Health   Financial Resource Strain:   .  Difficulty of Paying Living Expenses: Not on file  Food Insecurity:   . Worried About Charity fundraiser in the Last Year: Not on file  . Ran Out of Food in the Last Year: Not on file  Transportation Needs:   . Lack of Transportation (Medical): Not on file  . Lack of Transportation (Non-Medical): Not on file  Physical Activity:   . Days of Exercise per Week: Not on file  . Minutes of Exercise per Session: Not on file  Stress:   . Feeling of Stress : Not on file  Social Connections:   . Frequency of Communication with Friends and Family: Not on file  . Frequency of Social Gatherings with Friends and Family: Not on file  . Attends Religious Services: Not on file  . Active Member of Clubs or Organizations: Not on file  . Attends Archivist Meetings: Not on file  . Marital Status: Not on file  Intimate Partner Violence:   . Fear of Current or Ex-Partner: Not on file  . Emotionally Abused: Not on file  . Physically Abused: Not on file  . Sexually Abused: Not on file    Allergies:  Allergies  Allergen Reactions  . Paliperidone Other (See Comments)    hyperprolactinemia  . Risperidone Other (See Comments)    hyperprolactinemia    Medications: Current Outpatient Medications:  .  LATUDA 40 MG TABS tablet, Take 40 mg by mouth at bedtime., Disp: , Rfl:  .  Melatonin 3 MG TABS, Take 3 mg by mouth at bedtime as needed., Disp: , Rfl:  .  Norgestimate-Ethinyl Estradiol Triphasic (TRI-PREVIFEM) 0.18/0.215/0.25 MG-35 MCG tablet, TAKE 1 TABLET BY MOUTH EVERY DAY. (Patient taking differently: Take 1 tablet by mouth daily. TAKE 1 TABLET BY MOUTH EVERY DAY.), Disp: 84 tablet, Rfl: 3      Physical Exam Vitals: BP 122/74   Ht 5\' 5"  (1.651 m)   Wt 168 lb (76.2 kg)   LMP 05/01/2019   BMI 27.96 kg/m   General: BF in  NAD HEENT: normocephalic, anicteric Neck: no thyroid enlargement, no palpable nodules, no cervical lymphadenopathy  Pulmonary: No increased work of breathing,  CTAB Cardiovascular: RRR, without murmur  Breast: right breast 1/2 cup size> left, no tenderness, no palpable nodules or masses, no skin or nipple retraction present, no nipple discharge.  No axillary, infraclavicular or supraclavicular lymphadenopathy. Abdomen: Soft, non-tender, non-distended.  Umbilicus without lesions.  No hepatomegaly or masses palpable. No evidence of hernia. Genitourinary:  External: Inflammation/ irritation at introitus  Normal urethral meatus, normal Bartholin's and Skene's glands.    Vagina: Normal vaginal mucosa, no evidence of prolapse, moderate to large white mucoepithelial discharge   Cervix: Grossly normal in appearance,  non-tender  Uterus: Anteflexed, normal size, shape, and consistency, mobile, and non-tender  Adnexa: No adnexal masses, non-tender  Rectal: deferred  Lymphatic: no evidence of inguinal lymphadenopathy Extremities: no edema, erythema, or tenderness Neurologic: Grossly intact Psychiatric: mood appropriate, affect full  Wet  prep: negative hyphae, Trich, clue cells  Assessment: 29 y.o. annual gyn exam Surveillance of oral contraceptives  Plan:   1) Breast cancer screening - recommend monthly self breast exam.  2) STI screening was offered and declined.  3) Cervical cancer screening - Pap was not done. ASCCP guidelines and rational discussed.  Patient opts for every 3 year screening interval. Next Pap due in 2 years  4) Contraception - given refill of Tri previfem x 1 year.  5) Routine healthcare maintenance including cholesterol and diabetes screening UTD  6) RTO 1 year and prn.  Farrel Conners, CNM

## 2019-05-21 ENCOUNTER — Other Ambulatory Visit: Payer: Self-pay

## 2019-05-21 ENCOUNTER — Encounter: Payer: Self-pay | Admitting: Certified Nurse Midwife

## 2019-05-21 ENCOUNTER — Ambulatory Visit (INDEPENDENT_AMBULATORY_CARE_PROVIDER_SITE_OTHER): Payer: BC Managed Care – PPO | Admitting: Certified Nurse Midwife

## 2019-05-21 VITALS — BP 122/74 | Ht 65.0 in | Wt 168.0 lb

## 2019-05-21 DIAGNOSIS — Z1239 Encounter for other screening for malignant neoplasm of breast: Secondary | ICD-10-CM

## 2019-05-21 DIAGNOSIS — Z01419 Encounter for gynecological examination (general) (routine) without abnormal findings: Secondary | ICD-10-CM | POA: Diagnosis not present

## 2019-05-21 DIAGNOSIS — Z3041 Encounter for surveillance of contraceptive pills: Secondary | ICD-10-CM

## 2019-05-21 MED ORDER — NORGESTIM-ETH ESTRAD TRIPHASIC 0.18/0.215/0.25 MG-35 MCG PO TABS: ORAL_TABLET | ORAL | 3 refills | Status: DC

## 2020-06-09 ENCOUNTER — Other Ambulatory Visit: Payer: Self-pay

## 2020-06-09 MED ORDER — NORGESTIM-ETH ESTRAD TRIPHASIC 0.18/0.215/0.25 MG-35 MCG PO TABS: ORAL_TABLET | ORAL | 3 refills | Status: DC

## 2020-06-09 NOTE — Telephone Encounter (Signed)
Refill request recv'd for Tri-estarylla.  Refill sent.

## 2020-06-21 ENCOUNTER — Encounter: Payer: Self-pay | Admitting: Obstetrics and Gynecology

## 2020-06-21 ENCOUNTER — Other Ambulatory Visit: Payer: Self-pay

## 2020-06-21 ENCOUNTER — Ambulatory Visit (INDEPENDENT_AMBULATORY_CARE_PROVIDER_SITE_OTHER): Payer: 59 | Admitting: Obstetrics and Gynecology

## 2020-06-21 VITALS — BP 116/72 | Ht 65.0 in | Wt 166.8 lb

## 2020-06-21 DIAGNOSIS — N852 Hypertrophy of uterus: Secondary | ICD-10-CM

## 2020-06-21 DIAGNOSIS — Z1239 Encounter for other screening for malignant neoplasm of breast: Secondary | ICD-10-CM

## 2020-06-21 DIAGNOSIS — Z0000 Encounter for general adult medical examination without abnormal findings: Secondary | ICD-10-CM

## 2020-06-21 DIAGNOSIS — Z124 Encounter for screening for malignant neoplasm of cervix: Secondary | ICD-10-CM

## 2020-06-21 DIAGNOSIS — Z3041 Encounter for surveillance of contraceptive pills: Secondary | ICD-10-CM

## 2020-06-21 DIAGNOSIS — Z01419 Encounter for gynecological examination (general) (routine) without abnormal findings: Secondary | ICD-10-CM | POA: Diagnosis not present

## 2020-06-21 MED ORDER — NORGESTIM-ETH ESTRAD TRIPHASIC 0.18/0.215/0.25 MG-35 MCG PO TABS: ORAL_TABLET | ORAL | 3 refills | Status: AC

## 2020-06-21 NOTE — Patient Instructions (Signed)
Institute of Medicine Recommended Dietary Allowances for Calcium and Vitamin D  Age (yr) Calcium Recommended Dietary Allowance (mg/day) Vitamin D Recommended Dietary Allowance (international units/day)  9-18 1,300 600  19-50 1,000 600  51-70 1,200 600  71 and older 1,200 800  Data from Institute of Medicine. Dietary reference intakes: calcium, vitamin D. [place], [place]: National Academies Press; 2011.    Exercising to Stay Healthy To become healthy and stay healthy, it is recommended that you do moderate-intensity and vigorous-intensity exercise. You can tell that you are exercising at a moderate intensity if your heart starts beating faster and you start breathing faster but can still hold a conversation. You can tell that you are exercising at a vigorous intensity if you are breathing much harder and faster and cannot hold a conversation while exercising. Exercising regularly is important. It has many health benefits, such as:  Improving overall fitness, flexibility, and endurance.  Increasing bone density.  Helping with weight control.  Decreasing body fat.  Increasing muscle strength.  Reducing stress and tension.  Improving overall health. How often should I exercise? Choose an activity that you enjoy, and set realistic goals. Your health care provider can help you make an activity plan that works for you. Exercise regularly as told by your health care provider. This may include:  Doing strength training two times a week, such as: ? Lifting weights. ? Using resistance bands. ? Push-ups. ? Sit-ups. ? Yoga.  Doing a certain intensity of exercise for a given amount of time. Choose from these options: ? A total of 150 minutes of moderate-intensity exercise every week. ? A total of 75 minutes of vigorous-intensity exercise every week. ? A mix of moderate-intensity and vigorous-intensity exercise every week. Children, pregnant women, people who have not exercised  regularly, people who are overweight, and older adults may need to talk with a health care provider about what activities are safe to do. If you have a medical condition, be sure to talk with your health care provider before you start a new exercise program. What are some exercise ideas? Moderate-intensity exercise ideas include:  Walking 1 mile (1.6 km) in about 15 minutes.  Biking.  Hiking.  Golfing.  Dancing.  Water aerobics. Vigorous-intensity exercise ideas include:  Walking 4.5 miles (7.2 km) or more in about 1 hour.  Jogging or running 5 miles (8 km) in about 1 hour.  Biking 10 miles (16.1 km) or more in about 1 hour.  Lap swimming.  Roller-skating or in-line skating.  Cross-country skiing.  Vigorous competitive sports, such as football, basketball, and soccer.  Jumping rope.  Aerobic dancing.   What are some everyday activities that can help me to get exercise?  Yard work, such as: ? Pushing a lawn mower. ? Raking and bagging leaves.  Washing your car.  Pushing a stroller.  Shoveling snow.  Gardening.  Washing windows or floors. How can I be more active in my day-to-day activities?  Use stairs instead of an elevator.  Take a walk during your lunch break.  If you drive, park your car farther away from your work or school.  If you take public transportation, get off one stop early and walk the rest of the way.  Stand up or walk around during all of your indoor phone calls.  Get up, stretch, and walk around every 30 minutes throughout the day.  Enjoy exercise with a friend. Support to continue exercising will help you keep a regular routine of activity. What guidelines   can I follow while exercising?  Before you start a new exercise program, talk with your health care provider.  Do not exercise so much that you hurt yourself, feel dizzy, or get very short of breath.  Wear comfortable clothes and wear shoes with good support.  Drink plenty of  water while you exercise to prevent dehydration or heat stroke.  Work out until your breathing and your heartbeat get faster. Where to find more information  U.S[place] Department of Health and Human Services: www.hhs.gov  Centers for Disease Control and Prevention (CDC): www.cdc.gov Summary  Exercising regularly is important. It will improve your overall fitness, flexibility, and endurance.  Regular exercise also will improve your overall health. It can help you control your weight, reduce stress, and improve your bone density.  Do not exercise so much that you hurt yourself, feel dizzy, or get very short of breath.  Before you start a new exercise program, talk with your health care provider. This information is not intended to replace advice given to you by your health care provider. Make sure you discuss any questions you have with your health care provider. Document Revised: 04/13/2017 Document Reviewed: 03/22/2017 Elsevier Patient Education  2021 Elsevier Inc.   Budget-Friendly Healthy Eating There are many ways to save money at the grocery store and continue to eat healthy. You can be successful if you:  Plan meals according to your budget.  Make a grocery list and only purchase food according to your grocery list.  Prepare food yourself at home. What are tips for following this plan? Reading food labels  Compare food labels between brand name foods and the store brand. Often the nutritional value is the same, but the store brand is lower cost.  Look for products that do not have added sugar, fat, or salt (sodium). These often cost the same but are healthier for you. Products may be labeled as: ? Sugar-free. ? Nonfat. ? Low-fat. ? Sodium-free. ? Low-sodium.  Look for lean ground beef labeled as at least 92% lean and 8% fat. Shopping  Buy only the items on your grocery list and go only to the areas of the store that have the items on your list.  Use coupons only for  foods and brands you normally buy. Avoid buying items you wouldn't normally buy simply because they are on sale.  Check online and in newspapers for weekly deals.  Buy healthy items from the bulk bins when available, such as herbs, spices, flour, pasta, nuts, and dried fruit.  Buy fruits and vegetables that are in season. Prices are usually lower on in-season produce.  Look at the unit price on the price tag. Use it to compare different brands and sizes to find out which item is the best deal.  Choose healthy items that are often low-cost, such as carrots, potatoes, apples, bananas, and oranges. Dried or canned beans are a low-cost protein source.  Buy in bulk and freeze extra food. Items you can buy in bulk include meats, fish, poultry, frozen fruits, and frozen vegetables.  Avoid buying "ready-to-eat" foods, such as pre-cut fruits and vegetables and pre-made salads.  If possible, shop around to discover where you can find the best prices. Consider other retailers such as dollar stores, larger wholesale stores, local fruit and vegetable stands, and farmers markets.  Do not shop when you are hungry. If you shop while hungry, it may be hard to stick to your list and budget.  Resist impulse buying. Use your grocery   list as your official plan for the week.  Buy a variety of vegetables and fruits by purchasing fresh, frozen, and canned items.  Look at the top and bottom shelves for deals. Foods at eye level (eye level of an adult or child) are usually more expensive.  Be efficient with your time when shopping. The more time you spend at the store, the more money you are likely to spend.  To save money when choosing more expensive foods like meats and dairy: ? Choose cheaper cuts of meat, such as bone-in chicken thighs and drumsticks instead of skinless and boneless chicken. When you are ready to prepare the chicken, you can remove the skin yourself to make it healthier. ? Choose lean meats  like chicken or turkey instead of beef. ? Choose canned seafood, such as tuna, salmon, or sardines. ? Buy eggs as a low-cost source of protein. ? Buy dried beans and peas, such as lentils, split peas, or kidney beans instead of meats. Dried beans and peas are a good alternative source of protein. ? Buy the larger tubs of yogurt instead of individual-sized containers.  Choose water instead of sodas and other sweetened beverages.  Avoid buying chips, cookies, and other "junk food." These items are usually expensive and not healthy.   Cooking  Make extra food and freeze the extras in meal-sized containers or in individual portions for fast meals and snacks.  Pre-cook on days when you have extra time to prepare meals in advance. You can keep these meals in the fridge or freezer and reheat for a quick meal.  When you come home from the grocery store, wash, peel, and cut fruits and vegetables so they are ready to use and eat. This will help reduce food waste. Meal planning  Do not eat out or get fast food. Prepare food at home.  Make a grocery list and make sure to bring it with you to the store. If you have a smart phone, you could use your phone to create your shopping list.  Plan meals and snacks according to a grocery list and budget you create.  Use leftovers in your meal plan for the week.  Look for recipes where you can cook once and make enough food for two meals.  Prepare budget-friendly types of meals like stews, casseroles, and stir-fry dishes.  Try some meatless meals or try "no cook" meals like salads.  Make sure that half your plate is filled with fruits or vegetables. Choose from fresh, frozen, or canned fruits and vegetables. If eating canned, remember to rinse them before eating. This will remove any excess salt added for packaging. Summary  Eating healthy on a budget is possible if you plan your meals according to your budget, purchase according to your budget and  grocery list, and prepare food yourself.  Tips for buying more food on a limited budget include buying generic brands, using coupons only for foods you normally buy, and buying healthy items from the bulk bins when available.  Tips for buying cheaper food to replace expensive food include choosing cheaper, lean cuts of meat, and buying dried beans and peas. This information is not intended to replace advice given to you by your health care provider. Make sure you discuss any questions you have with your health care provider. Document Revised: 02/12/2020 Document Reviewed: 02/12/2020 Elsevier Patient Education  2021 Elsevier Inc.   Bone Health Bones protect organs, store calcium, anchor muscles, and support the whole body. Keeping your bones   strong is important, especially as you get older. You can take actions to help keep your bones strong and healthy. Why is keeping my bones healthy important? Keeping your bones healthy is important because your body constantly replaces bone cells. Cells get old, and new cells take their place. As we age, we lose bone cells because the body may not be able to make enough new cells to replace the old cells. The amount of bone cells and bone tissue you have is referred to as bone mass. The higher your bone mass, the stronger your bones. The aging process leads to an overall loss of bone mass in the body, which can increase the likelihood of:  Joint pain and stiffness.  Broken bones.  A condition in which the bones become weak and brittle (osteoporosis). A large decline in bone mass occurs in older adults. In women, it occurs about the time of menopause.   What actions can I take to keep my bones healthy? Good health habits are important for maintaining healthy bones. This includes eating nutritious foods and exercising regularly. To have healthy bones, you need to get enough of the right minerals and vitamins. Most nutrition experts recommend getting these  nutrients from the foods that you eat. In some cases, taking supplements may also be recommended. Doing certain types of exercise is also important for bone health. What are the nutritional recommendations for healthy bones? Eating a well-balanced diet with plenty of calcium and vitamin D will help to protect your bones. Nutritional recommendations vary from person to person. Ask your health care provider what is healthy for you. Here are some general guidelines. Get enough calcium Calcium is the most important (essential) mineral for bone health. Most people can get enough calcium from their diet, but supplements may be recommended for people who are at risk for osteoporosis. Good sources of calcium include:  Dairy products, such as low-fat or nonfat milk, cheese, and yogurt.  Dark green leafy vegetables, such as bok choy and broccoli.  Calcium-fortified foods, such as orange juice, cereal, bread, soy beverages, and tofu products.  Nuts, such as almonds. Follow these recommended amounts for daily calcium intake:  Children, age 1-3: 700 mg.  Children, age 4-8: 1,000 mg.  Children, age 9-13: 1,300 mg.  Teens, age 14-18: 1,300 mg.  Adults, age 19-50: 1,000 mg.  Adults, age 51-70: ? Men: 1,000 mg. ? Women: 1,200 mg.  Adults, age 71 or older: 1,200 mg.  Pregnant and breastfeeding females: ? Teens: 1,300 mg. ? Adults: 1,000 mg. Get enough vitamin D Vitamin D is the most essential vitamin for bone health. It helps the body absorb calcium. Sunlight stimulates the skin to make vitamin D, so be sure to get enough sunlight. If you live in a cold climate or you do not get outside often, your health care provider may recommend that you take vitamin D supplements. Good sources of vitamin D in your diet include:  Egg yolks.  Saltwater fish.  Milk and cereal fortified with vitamin D. Follow these recommended amounts for daily vitamin D intake:  Children and teens, age 1-18: 600  international units.  Adults, age 50 or younger: 400-800 international units.  Adults, age 51 or older: 800-1,000 international units. Get other important nutrients Other nutrients that are important for bone health include:  Phosphorus. This mineral is found in meat, poultry, dairy foods, nuts, and legumes. The recommended daily intake for adult men and adult women is 700 mg.  Magnesium. This mineral   is found in seeds, nuts, dark green vegetables, and legumes. The recommended daily intake for adult men is 400-420 mg. For adult women, it is 310-320 mg.  Vitamin K. This vitamin is found in green leafy vegetables. The recommended daily intake is 120 mg for adult men and 90 mg for adult women.   What type of physical activity is best for building and maintaining healthy bones? Weight-bearing and strength-building activities are important for building and maintaining healthy bones. Weight-bearing activities cause muscles and bones to work against gravity. Strength-building activities increase the strength of the muscles that support bones. Weight-bearing and muscle-building activities include:  Walking and hiking.  Jogging and running.  Dancing.  Gym exercises.  Lifting weights.  Tennis and racquetball.  Climbing stairs.  Aerobics. Adults should get at least 30 minutes of moderate physical activity on most days. Children should get at least 60 minutes of moderate physical activity on most days. Ask your health care provider what type of exercise is best for you.   How can I find out if my bone mass is low? Bone mass can be measured with an X-ray test called a bone mineral density (BMD) test. This test is recommended for all women who are age 65 or older. It may also be recommended for:  Men who are age 70 or older.  People who are at risk for osteoporosis because of: ? Having bones that break easily. ? Having a long-term disease that weakens bones, such as kidney disease or  rheumatoid arthritis. ? Having menopause earlier than normal. ? Taking medicine that weakens bones, such as steroids, thyroid hormones, or hormone treatment for breast cancer or prostate cancer. ? Smoking. ? Drinking three or more alcoholic drinks a day. If you find that you have a low bone mass, you may be able to prevent osteoporosis or further bone loss by changing your diet and lifestyle. Where can I find more information? For more information, check out the following websites:  National Osteoporosis Foundation: www.nof.org/patients  National Institutes of Health: www.bones.nih.gov  International Osteoporosis Foundation: www.iofbonehealth.org Summary  The aging process leads to an overall loss of bone mass in the body, which can increase the likelihood of broken bones and osteoporosis.  Eating a well-balanced diet with plenty of calcium and vitamin D will help to protect your bones.  Weight-bearing and strength-building activities are also important for building and maintaining strong bones.  Bone mass can be measured with an X-ray test called a bone mineral density (BMD) test. This information is not intended to replace advice given to you by your health care provider. Make sure you discuss any questions you have with your health care provider. Document Revised: 05/28/2017 Document Reviewed: 05/28/2017 Elsevier Patient Education  2021 Elsevier Inc.   

## 2020-06-21 NOTE — Progress Notes (Signed)
Annual Exam

## 2020-06-21 NOTE — Progress Notes (Signed)
Gynecology Annual Exam  PCP: Patient, No Pcp Per  Chief Complaint:  Chief Complaint  Patient presents with  . Gynecologic Exam    Annual Exam    History of Present Illness: Patient is a 30 y.o. G0P0000 presents for annual exam. The patient has no complaints today.   LMP: Patient's last menstrual period was 05/25/2020. Average Interval: monthly Duration of flow: 5 days Heavy Menses: 1-2 days of heavy bleeding that then improves Intermenstrual Bleeding: no Dysmenorrhea: no  There is no notable family history of breast or ovarian cancer in her family.  The patient reports her exercise generally consists of martial arts 3-4 times a week.  The patient denies current symptoms of depression.   PHQ-9: 2 GAD-7: 0   Review of Systems: Review of Systems  Constitutional: Negative for chills, fever, malaise/fatigue and weight loss.  HENT: Negative for congestion, hearing loss and sinus pain.   Eyes: Negative for blurred vision and double vision.  Respiratory: Negative for cough, sputum production, shortness of breath and wheezing.   Cardiovascular: Negative for chest pain, palpitations, orthopnea and leg swelling.  Gastrointestinal: Negative for abdominal pain, constipation, diarrhea, nausea and vomiting.  Genitourinary: Negative for dysuria, flank pain, frequency, hematuria and urgency.  Musculoskeletal: Negative for back pain, falls and joint pain.  Skin: Negative for itching and rash.  Neurological: Negative for dizziness and headaches.  Psychiatric/Behavioral: Negative for depression, substance abuse and suicidal ideas. The patient is not nervous/anxious.     Past Medical History:  Past Medical History:  Diagnosis Date  . History of substance abuse (HCC)   . Schizoaffective disorder, bipolar type [facility])     Past Surgical History:  Past Surgical History:  Procedure Laterality Date  . WISDOM TOOTH EXTRACTION      Gynecologic History:  Patient's last menstrual period  was 05/25/2020. Menarche: 11  History of fibroids, polyps, or ovarian cysts? : No  History of PCOS? No Hstory of Endometriosis? No History of abnormal pap smears? No Have you had any sexually transmitted infections in the past? No  She is uncertain if she had HPV vaccination in the past. She declines vaccination today  Last Pap: Results were: 05/17/2018 NIL   She identifies as a female. She is sexually active with men.   She denies dyspareunia. She denies postcoital bleeding.  She currently uses OCP (estrogen/progesterone) for contraception.    Obstetric History: G0P0000  Family History:  Family History  Problem Relation Age of Onset  . Hyperlipidemia Mother   . Hypertension Mother   . Brain cancer Paternal Grandmother   . Breast cancer Maternal Aunt 60  . Gout Father   . Diverticulosis Father   . Stroke Maternal Uncle     Social History:  Social History   Socioeconomic History  . Marital status: Single    Spouse name: Not on file  . Number of children: Not on file  . Years of education: Not on file  . Highest education level: Not on file  Occupational History  . Not on file  Tobacco Use  . Smoking status: Former Games developer  . Smokeless tobacco: Never Used  Vaping Use  . Vaping Use: Never used  Substance and Sexual Activity  . Alcohol use: Not Currently  . Drug use: Not Currently    Comment: Former MJ use  . Sexual activity: Yes    Partners: Male    Birth control/protection: Pill  Other Topics Concern  . Not on file  Social History Narrative  .  Not on file   Social Determinants of Health   Financial Resource Strain: Not on file  Food Insecurity: Not on file  Transportation Needs: Not on file  Physical Activity: Not on file  Stress: Not on file  Social Connections: Not on file  Intimate Partner Violence: Not on file    Allergies:  Allergies  Allergen Reactions  . Paliperidone Other (See Comments)    hyperprolactinemia  . Risperidone Other (See  Comments)    hyperprolactinemia    Medications: Prior to Admission medications   Medication Sig Start Date End Date Taking? Authorizing Provider  LATUDA 40 MG TABS tablet Take 40 mg by mouth at bedtime. 05/12/19   [provider]  Melatonin 3 MG TABS Take 3 mg by mouth at bedtime as needed. 02/25/18   [provider]  Norgestimate-Ethinyl Estradiol Triphasic (TRI-PREVIFEM) 0.18/0.215/0.25 MG-35 MCG tablet TAKE 1 TABLET BY MOUTH EVERY DAY. 06/09/20   Natale Milch, MD    Physical Exam Vitals: There were no vitals taken for this visit.  Physical Exam Constitutional:      Appearance: She is well-developed.  Genitourinary:     Vagina and uterus normal.     There is no lesion on the right labia.     There is no lesion on the left labia.    No lesions in the vagina.     Genitourinary Comments: External: Vulva normal. No lesions noted.  Speculum examination:  Deferred Bimanual examination: Uterus enlarged, midline 12-14 cm in size.  No CMT. Adnexa normal. Pelvis is not fixed.       No cervical motion tenderness.  Breasts:     Right: No inverted nipple, mass, nipple discharge or skin change.     Left: No inverted nipple, mass, nipple discharge or skin change.    HENT:     Head: Normocephalic and atraumatic.  Eyes:     Extraocular Movements: EOM normal.  Neck:     Thyroid: No thyromegaly.  Cardiovascular:     Rate and Rhythm: Normal rate and regular rhythm.     Heart sounds: Normal heart sounds.  Pulmonary:     Effort: Pulmonary effort is normal.     Breath sounds: Normal breath sounds.  Abdominal:     General: Bowel sounds are normal. There is no distension.     Palpations: Abdomen is soft. There is no mass.  Musculoskeletal:     Cervical back: Neck supple.  Neurological:     Mental Status: She is alert and oriented to person, place, and time.  Skin:    General: Skin is warm and dry.  Psychiatric:        Mood and Affect: Mood and affect normal.         Behavior: Behavior normal.        Thought Content: Thought content normal.        Judgment: Judgment normal.  Vitals reviewed.      Female chaperone present for pelvic and breast  portions of the physical exam  Assessment: 30 y.o. G0P0000 routine annual exam  Plan: Problem List Items Addressed This Visit   None   Visit Diagnoses    Encounter for annual routine gynecological examination    -  Primary   Health maintenance examination       Encounter for screening breast examination       Encounter for gynecological examination without abnormal finding       Cervical cancer screening  Encounter for birth control pills maintenance       Relevant Medications   Norgestimate-Ethinyl Estradiol Triphasic (TRI-PREVIFEM) 0.18/0.215/0.25 MG-35 MCG tablet   Enlarged uterus       Relevant Orders   US PELVIS TRANSVAGINAL NON-OB (TV ONLY)      1) STI screening was offered and declined  2) ASCCP guidelines and rational discussed.  Patient opts for every 3 years screening interval  3) Contraception - would liek to continue OCP  4) Routine healthcare maintenance including cholesterol, diabetes screening discussed.- Declines labs today.  5) Bulky and enlarged uterus on pelvic exam- will return for a pelvic US.   Adelene Idler MD, [facility], [facility] 06/21/2020 2:05 PM

## 2020-07-19 ENCOUNTER — Ambulatory Visit: Payer: 59

## 2020-07-19 ENCOUNTER — Ambulatory Visit: Payer: 59 | Admitting: Obstetrics and Gynecology

## 2020-07-19 DIAGNOSIS — N852 Hypertrophy of uterus: Secondary | ICD-10-CM

## 2020-08-17 ENCOUNTER — Ambulatory Visit: Payer: 59

## 2020-08-17 ENCOUNTER — Ambulatory Visit: Payer: 59 | Admitting: Obstetrics and Gynecology

## 2020-08-19 ENCOUNTER — Ambulatory Visit: Payer: 59

## 2020-08-23 ENCOUNTER — Ambulatory Visit: Payer: 59 | Admitting: Obstetrics and Gynecology

## 2020-09-23 ENCOUNTER — Ambulatory Visit: Payer: 59 | Admitting: Obstetrics and Gynecology

## 2020-09-24 ENCOUNTER — Encounter: Payer: Self-pay | Admitting: Obstetrics and Gynecology

## 2020-09-24 ENCOUNTER — Ambulatory Visit (INDEPENDENT_AMBULATORY_CARE_PROVIDER_SITE_OTHER): Payer: 59 | Admitting: Obstetrics and Gynecology

## 2020-09-24 VITALS — Ht 65.0 in | Wt 165.0 lb

## 2020-09-24 DIAGNOSIS — N852 Hypertrophy of uterus: Secondary | ICD-10-CM | POA: Diagnosis not present

## 2020-09-24 NOTE — Progress Notes (Signed)
Virtual Visit via Telephone Note  I connected with Jodi Stephens on 09/24/20 at 11:10 AM EDT by telephone and verified that I am speaking with the correct person using two identifiers.   I discussed the limitations, risks, security and privacy concerns of performing an evaluation and management service by telephone and the availability of in person appointments. I also discussed with the patient that there may be a patient responsible charge related to this service. The patient expressed understanding and agreed to proceed.  The patient was at home I spoke with the patient from my  office The names of people involved in this encounter were: Dr. Jerene Pitch and [facility].  History of Present Illness: She has no new complaints and is feeling well.    Observations/Objective:   Physical Exam could not be performed. Because of the COVID-19 outbreak this visit was performed over the phone and not in person.   Assessment and Plan: 30 yo with enlarged uterus, we reviewed her [place] results which showed a single 1 cm fibroid and a simple ovarian cyst.  Provided the patient with resources in her after visit summary for the review regarding fibroids.  Patient is not currently symptomatic of fibroids and does not desire treatment for this at this time.  Discussed reasons for additional imaging or follow-up regarding related to fibroid such as heavy bleeding or symptoms of fibroids such as pelvic pain or pressure.  Follow Up Instructions: As needed- annual in 1 year   I discussed the assessment and treatment plan with the patient. The patient was provided an opportunity to ask questions and all were answered. The patient agreed with the plan and demonstrated an understanding of the instructions.   The patient was advised to call back or seek an in-person evaluation if the symptoms worsen or if the condition fails to improve as anticipated.  I provided 5 minutes of non-face-to-face time during this  encounter.  Adelene Idler MD [facility], [facility] 09/24/2020 11:27 AM

## 2020-09-24 NOTE — Patient Instructions (Signed)
Weblink for ACOG information on fibroids:  [email or web address]  Uterine Fibroids  Uterine fibroids, also called leiomyomas, are noncancerous (benign) tumors that can grow in the uterus. They can cause heavy menstrual bleeding and pain. Fibroids may also grow in the fallopian tubes, cervix, or tissues (ligaments) near the uterus. You may have one or many fibroids. Fibroids vary in size, weight, and where they grow in the uterus. Some can become quite large. Most fibroids do not require medical treatment. What are the causes? The cause of this condition is not known. What increases the risk? You are more likely to develop this condition if you:  Are in your 30s or 40s and have not gone through menopause.  Have a family history of this condition.  Are of African American descent.  Started your menstrual period at age 29 or younger.  Have never given birth.  Are overweight or obese. What are the signs or symptoms? Many women do not have any symptoms. Symptoms of this condition may include:  Heavy menstrual bleeding.  Bleeding between menstrual periods.  Pain and pressure in the pelvic area, between your hip bones.  Pain during sex.  Bladder problems, such as needing to urinate right away or more often than usual.  Inability to have children (infertility).  Failure to carry pregnancy to term (miscarriage). How is this diagnosed? This condition may be diagnosed based on:  Your symptoms and medical history.  A physical exam.  A pelvic exam that includes feeling for any tumors.  Imaging tests, such as ultrasound or MRI. How is this treated? Treatment for this condition may include follow-up visits with your health care provider to monitor your fibroids for any changes. Other treatment may include:  Medicines, such as: ? Medicines to relieve pain, including aspirin and NSAIDs, such as ibuprofen or naproxen. ? Hormone therapy.  Treatment may be given as a pill or an injection, or it may be inserted into the uterus using an intrauterine device (IUD).  Surgery that would do one of the following: ? Remove the fibroids (myomectomy). This may be recommended if fibroids affect your fertility and you want to become pregnant. ? Remove the uterus (hysterectomy). ? Block the blood supply to the fibroids (uterine artery embolization). This can cause them to shrink and die. Follow these instructions at home: Medicines  Take over-the-counter and prescription medicines only as told by your health care provider.  Ask your health care provider if you should take iron pills or eat more iron-rich foods, such as dark green, leafy vegetables. Heavy menstrual bleeding can cause low iron levels. Managing pain If directed, apply heat to your back or abdomen to reduce pain. Use the heat source that your health care provider recommends, such as a moist heat pack or a heating pad. To apply heat:  Place a towel between your skin and the heat source.  Leave the heat on for 20-30 minutes.  Remove the heat if your skin turns bright red. This is especially important if you are unable to feel pain, heat, or cold. You may have a greater risk of getting burned.   General instructions  Pay close attention to your menstrual cycle. Tell your health care provider about any changes, such as: ? Heavier bleeding that requires you to change your pads or tampons more than usual. ? A change in the number of days that your menstrual period lasts. ? A change in symptoms that come with your menstrual period, such as back pain or cramps in  your abdomen.  Keep all follow-up visits. This is important, especially if your fibroids need to be monitored for any changes. Contact a health care provider if you:  Have pelvic pain, back pain, or cramps in your abdomen that do not get better with medicine or heat.  Develop new bleeding between menstrual  periods.  Have increased bleeding during or between menstrual periods.  Feel more tired or weak than usual.  Feel light-headed. Get help right away if you:  Faint.  Have pelvic pain that suddenly gets worse.  Have severe vaginal bleeding that soaks a tampon or pad in 30 minutes or less. Summary  Uterine fibroids are noncancerous (benign) tumors that can develop in the uterus.  The exact cause of this condition is not known.  Most fibroids do not require medical treatment unless they affect your ability to have children (fertility).  Contact a health care provider if you have pelvic pain, back pain, or cramps in your abdomen that do not get better with medicines.  Get help right away if you faint, have pelvic pain that suddenly gets worse, or have severe vaginal bleeding. This information is not intended to replace advice given to you by your health care provider. Make sure you discuss any questions you have with your health care provider. Document Revised: 12/02/2019 Document Reviewed: 12/02/2019 Elsevier Patient Education  2021 [employer].

## 2021-04-11 ENCOUNTER — Telehealth: Payer: Self-pay | Admitting: Obstetrics and Gynecology

## 2021-04-11 NOTE — Telephone Encounter (Signed)
Patient coming in on 12/001/2022 at 10:50 for possible nexplanon insertion.

## 2021-04-13 NOTE — Telephone Encounter (Signed)
Nexplanon reserved for this patient. 

## 2021-04-13 NOTE — Progress Notes (Addendum)
    Chief Complaint  Patient presents with   Contraception    Wants Nexplanon     HPI:  Jodi Stephens is a 30 y.o. G0P0000 here for Nexplanon insertion. Currently on OCPs with monthly menses, lasting 4-5 days, no BTB, no dysmen. She is sex active, on OCPs since LMP.   Neg pap 05/17/18  BP 110/60   Ht 5\' 5"  (1.651 m)   Wt 182 lb (82.6 kg)   LMP 04/03/2021 (Exact Date)   BMI 30.29 kg/m    Nexplanon Insertion  Patient given informed consent, signed copy in the chart, time out was performed.  Appropriate time out taken.  Patient's RIGHT arm was prepped and draped in the usual sterile fashion. The ruler used to measure and mark insertion area.  Pt was prepped with betadine swab and then injected with 1.0 cc of 2% lidocaine with epinephrine. Nexplanon removed form packaging,  Device confirmed in needle, then inserted full length of needle and withdrawn per handbook instructions.  Pt insertion site covered with steri-strip and a bandage.   Minimal blood loss.  Pt tolerated the procedure welL.  Assessment: Nexplanon insertion - Plan: etonogestrel (NEXPLANON) 68 MG IMPL implant  Can stop OCPs, use condoms for 7 days.   Meds ordered this encounter  Medications   etonogestrel (NEXPLANON) 68 MG IMPL implant    Sig: 1 each (68 mg total) by Subdermal route once for 1 dose.    Dispense:  1 each    Refill:  0    Order Specific Question:   Supervising Provider    Answer:   04/05/2021 Nadara Mustard     Plan:   She was told to remove the dressing in 12-24 hours, to keep the incision area dry for 24 hours and to remove the Steristrip in 2-3  days.  Notify 04-29-1992 if any signs of tenderness, redness, pain, or fevers develop.   Avelina Mcclurkin B. Donaciano Range, PA-C 04/14/2021 12:13 PM

## 2021-04-14 ENCOUNTER — Ambulatory Visit (INDEPENDENT_AMBULATORY_CARE_PROVIDER_SITE_OTHER): Payer: 59 | Admitting: Obstetrics and Gynecology

## 2021-04-14 ENCOUNTER — Encounter: Payer: Self-pay | Admitting: Obstetrics and Gynecology

## 2021-04-14 ENCOUNTER — Other Ambulatory Visit: Payer: Self-pay

## 2021-04-14 VITALS — BP 110/60 | Ht 65.0 in | Wt 182.0 lb

## 2021-04-14 DIAGNOSIS — Z30017 Encounter for initial prescription of implantable subdermal contraceptive: Secondary | ICD-10-CM

## 2021-04-14 MED ORDER — ETONOGESTREL 68 MG ~~LOC~~ IMPL: 1.0000 | DRUG_IMPLANT | Freq: Once | SUBCUTANEOUS | 0 refills | Status: AC

## 2021-04-14 NOTE — Patient Instructions (Signed)
I value your feedback and you entrusting us with your care. If you get a [place] patient survey, I would appreciate you taking the time to let us know about your experience today. Thank you!  Remove the dressing in 24 hours,  keep the incision area dry for 24 hours and remove the Steristrip in 2-3  days.  Notify us if any signs of tenderness, redness, pain, or fevers develop.   

## 2021-04-19 NOTE — Telephone Encounter (Signed)
Nexplanon rcd/charged 04/14/2021
